# Patient Record
Sex: Male | Born: 1975 | Race: White | Hispanic: No | Marital: Married | Smoking: Light tobacco smoker
Health system: Southern US, Community
[De-identification: ages and names within clinical notes are randomized; demographics above are authoritative.]

## PROBLEM LIST (undated history)

## (undated) ENCOUNTER — Emergency Department (HOSPITAL_COMMUNITY): Discharge status: Absent without leave | Payer: Self-pay

## (undated) DIAGNOSIS — B1920 Unspecified viral hepatitis C without hepatic coma: Secondary | ICD-10-CM

---

## 2014-12-21 ENCOUNTER — Observation Stay (HOSPITAL_COMMUNITY)
Admission: EM | Admit: 2014-12-21 | Discharge: 2014-12-22 | Disposition: A | Discharge status: Home / Self Care | Payer: Self-pay | Attending: Internal Medicine | Admitting: Internal Medicine

## 2014-12-21 ENCOUNTER — Encounter (HOSPITAL_COMMUNITY): Payer: Self-pay | Admitting: *Deleted

## 2014-12-21 DIAGNOSIS — T672XXA Heat cramp, initial encounter: Principal | ICD-10-CM

## 2014-12-21 DIAGNOSIS — R748 Abnormal levels of other serum enzymes: Secondary | ICD-10-CM | POA: Diagnosis present

## 2014-12-21 DIAGNOSIS — R7401 Elevation of levels of liver transaminase levels: Secondary | ICD-10-CM

## 2014-12-21 DIAGNOSIS — R740 Nonspecific elevation of levels of transaminase and lactic acid dehydrogenase [LDH]: Secondary | ICD-10-CM | POA: Insufficient documentation

## 2014-12-21 DIAGNOSIS — X30XXXA Exposure to excessive natural heat, initial encounter: Secondary | ICD-10-CM | POA: Insufficient documentation

## 2014-12-21 DIAGNOSIS — M6282 Rhabdomyolysis: Secondary | ICD-10-CM

## 2014-12-21 DIAGNOSIS — R7301 Impaired fasting glucose: Secondary | ICD-10-CM | POA: Diagnosis present

## 2014-12-21 DIAGNOSIS — N179 Acute kidney failure, unspecified: Secondary | ICD-10-CM

## 2014-12-21 DIAGNOSIS — Y9389 Activity, other specified: Secondary | ICD-10-CM | POA: Insufficient documentation

## 2014-12-21 DIAGNOSIS — Y9289 Other specified places as the place of occurrence of the external cause: Secondary | ICD-10-CM | POA: Insufficient documentation

## 2014-12-21 DIAGNOSIS — Y998 Other external cause status: Secondary | ICD-10-CM | POA: Insufficient documentation

## 2014-12-21 LAB — URINALYSIS, ROUTINE W REFLEX MICROSCOPIC
Bilirubin Urine: NEGATIVE
GLUCOSE, UA: NEGATIVE mg/dL
Hgb urine dipstick: NEGATIVE
Ketones, ur: 15 mg/dL — AB
LEUKOCYTES UA: NEGATIVE
NITRITE: NEGATIVE
PH: 5.5 (ref 5.0–8.0)
PROTEIN: NEGATIVE mg/dL
Specific Gravity, Urine: 1.026 (ref 1.005–1.030)
Urobilinogen, UA: 1 mg/dL (ref 0.0–1.0)

## 2014-12-21 LAB — COMPREHENSIVE METABOLIC PANEL
ALT: 195 U/L — ABNORMAL HIGH (ref 17–63)
ANION GAP: 8 (ref 5–15)
AST: 108 U/L — ABNORMAL HIGH (ref 15–41)
Albumin: 4.4 g/dL (ref 3.5–5.0)
Alkaline Phosphatase: 93 U/L (ref 38–126)
BUN: 22 mg/dL — ABNORMAL HIGH (ref 6–20)
CHLORIDE: 99 mmol/L — AB (ref 101–111)
CO2: 28 mmol/L (ref 22–32)
Calcium: 9.6 mg/dL (ref 8.9–10.3)
Creatinine, Ser: 1.55 mg/dL — ABNORMAL HIGH (ref 0.61–1.24)
GFR, EST NON AFRICAN AMERICAN: 55 mL/min — AB (ref >60)
Glucose, Bld: 169 mg/dL — ABNORMAL HIGH (ref 65–99)
POTASSIUM: 4.3 mmol/L (ref 3.5–5.1)
Sodium: 135 mmol/L (ref 135–145)
Total Bilirubin: 1.6 mg/dL — ABNORMAL HIGH (ref 0.3–1.2)
Total Protein: 8.5 g/dL — ABNORMAL HIGH (ref 6.5–8.1)

## 2014-12-21 LAB — CBC WITH DIFFERENTIAL/PLATELET
BASOS ABS: 0 10*3/uL (ref 0.0–0.1)
Basophils Relative: 0 % (ref 0–1)
Eosinophils Absolute: 0.1 10*3/uL (ref 0.0–0.7)
Eosinophils Relative: 2 % (ref 0–5)
HCT: 43.2 % (ref 39.0–52.0)
HEMOGLOBIN: 15.6 g/dL (ref 13.0–17.0)
LYMPHS ABS: 2.2 10*3/uL (ref 0.7–4.0)
LYMPHS PCT: 23 % (ref 12–46)
MCH: 30.8 pg (ref 26.0–34.0)
MCHC: 36.1 g/dL — ABNORMAL HIGH (ref 30.0–36.0)
MCV: 85.2 fL (ref 78.0–100.0)
Monocytes Absolute: 1.1 10*3/uL — ABNORMAL HIGH (ref 0.1–1.0)
Monocytes Relative: 12 % (ref 3–12)
NEUTROS PCT: 63 % (ref 43–77)
Neutro Abs: 6.1 10*3/uL (ref 1.7–7.7)
Platelets: 214 10*3/uL (ref 150–400)
RBC: 5.07 MIL/uL (ref 4.22–5.81)
RDW: 12.2 % (ref 11.5–15.5)
WBC: 9.6 10*3/uL (ref 4.0–10.5)

## 2014-12-21 LAB — CK: Total CK: 1627 U/L — ABNORMAL HIGH (ref 49–397)

## 2014-12-21 MED ORDER — ACETAMINOPHEN 650 MG RE SUPP
650.0000 mg | Freq: Four times a day (QID) | RECTAL | Status: DC | PRN
Start: 2014-12-21 — End: 2014-12-22

## 2014-12-21 MED ORDER — SODIUM CHLORIDE 0.9 % IJ SOLN
3.0000 mL | Freq: Two times a day (BID) | INTRAMUSCULAR | Status: DC
  Administered 2014-12-21: 10 mL via INTRAVENOUS

## 2014-12-21 MED ORDER — ACETAMINOPHEN 325 MG PO TABS: 650.0000 mg | ORAL_TABLET | Freq: Four times a day (QID) | ORAL | Status: DC | PRN

## 2014-12-21 MED ORDER — SODIUM CHLORIDE 0.9 % IV SOLN
INTRAVENOUS | Status: AC
  Administered 2014-12-21: 19:00:00 via INTRAVENOUS

## 2014-12-21 MED ORDER — SODIUM CHLORIDE 0.9 % IV BOLUS (SEPSIS)
1000.0000 mL | Freq: Once | INTRAVENOUS | Status: AC
  Administered 2014-12-21: 1000 mL via INTRAVENOUS

## 2014-12-21 NOTE — ED Notes (Signed)
Patient states "my whole body is cramping".   Patient states "I tweaked my back about 3 wks ago and my lower back hurts worse".

## 2014-12-21 NOTE — H&P (Signed)
Date: 12/21/2014               Patient Name:  Jerry Mitchell MRN: [id number]  DOB: April 13, 1976 Age / Sex: 39 y.o., male   PCP: None              Medical Service: Internal Medicine Teaching Service     I have reviewed the note by Jerry Mitchell MS4 and was present during the interview and physical exam.  Please see below for findings, assessment, and plan.  Chief Complaint: Dehydration  History of Present Illness: Jerry Mitchell is a 39yo gentleman with no significant PMH who presents to the ED complaining of dehydration for the past day. He has been working on a roof in the heat all day when the symptoms began to present on 12/20/14. Symptoms continued overnight so he came to the ED. He endorses generalized muscle pains, weakness to the point of difficulty holding a hammer, inability to close his mouth, and nausea with two small episodes of NBNB emesis. He endorses lower back pain which started a few weeks prior from lifting weights. He denies headaches, shortness of breath, chest pain, abdominal pain, dysuria or hematuria.  In the ED, patient was found to have CK of 1627 with an elevated creatinine of 1.55. AST/ALT also elevated to 108/195. The patient was given liter bolus of normal saline x2 and admitted for further care.  Meds: No current facility-administered medications for this encounter.    Allergies: Allergies as of 12/21/2014  . (No Known Allergies)    Past Medical History: History reviewed. No pertinent past medical history.  Family History: No family history on file.  Social History: Social History   Social History  . Marital Status: Single    Spouse Name: N/A  . Number of Children: N/A  . Years of Education: N/A   Occupational History  . Not on file.   Social History Main Topics  . Smoking status: Never Smoker   . Smokeless tobacco: Not on file  . Alcohol Use: No  . Drug Use: No  . Sexual Activity: Not on file   Other Topics Concern  . Not on file   Social  History Narrative  . No narrative on file   Surgical History: History reviewed. No pertinent past surgical history.  Review of System: A comprehensive review of systems was negative unless noted in HPI  Physical Exam: Blood pressure 121/76, pulse 74, temperature 97.9 F (36.6 C), temperature source Oral, resp. rate 16, height 5\' 7"  (1.702 m), weight 110.995 kg (244 lb 11.2 oz), SpO2 97 %.  Physical Exam Gen: Well appearing, NAD, sitting up in bed HEENT: MMM, EOMI, no LAD CV: RRR, normal S1 S2, no murmurs, rubs, or gallops appreciated. Pulm: Clear to ascultation bilaterally, no wheezes or crackles. GI: +BS, nondistended, nontender to palpation, no hepatomegaly Ext: Diffuse soreness to extremities, particularly lower extremities. Pulses 2+ bilaterally, warm to touch, no lower extremity edema. Warm and well perfused. Neuro: Motor and sensory grossly normal Psych: A&Ox3  Labs: Reviewed as noted in the Electronic Record  Imaging: Reviewed as noted in the Electronic Record  Assessment & Plan by Problem:  Jerry Mitchell is a 39yo gentleman who presents with rhabdomyolysis.  *Rhabdomyolysis: He has been working in the heat on rooftops when symptoms of generalized myalgias, weakness, and nausea/vomiting presented. CK upon admission to 1627 along with transaminitis and AKI. N S/p 1L bolus NS x2 in ED. Not on any medications that could cause elevated CK. Potassium  normal at 4.3. - continue to monitor electrolytes - NS @ 123ml/hr  - AM CMP  *Acute Kidney Injury: Most likely secondary to rhabdo. Elevated to 1.55 although baseline is unknown. Urinalysis showed amber urine but no blood present. Urine continues to be dark after fluid boluses. - Continue fluid management as above - CMP in AM  *Transaminitis: AST/ALT elevated to 108/195 with elevated bilirubin to 1.6 on presentation. Most likely due to rhabdo. No upper quadrant pain or hepatomegaly suggestive of hepatitis. Tbili slightly up at  1.6. - fluid management as above - morning CMP  FEN/GI: Regular diet  Dispo: Disposition is deferred at this time, awaiting improvement of current medical problems. Anticipated discharge in approximately 1 day(s).   The patient does not have a current PCP (No primary care provider on file.) and does not need an [facility] hospital follow-up appointment after discharge.  The patient does not know have transportation limitations that hinder transportation to clinic appointments.  Signed: Rozelle Logan, Med Student 12/21/2014, 5:36 PM

## 2014-12-21 NOTE — ED Notes (Signed)
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 Pt states that he feels like he is dehydrated and needs fluids

## 2014-12-21 NOTE — H&P (Signed)
Date: 12/21/2014               Patient Name:  Jerry Mitchell MRN: [id number]  DOB: [birth year] Age / Sex: 39 y.o., male   PCP: No primary care provider on file.         Medical Service: Internal Medicine Teaching Service         Attending Physician: Dr. Doneen Poisson, MD    First Contact: Eliezer Mccoy MS4 Pager: [phone]  Second Contact: Dr. 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  He started having muscle cramps and dark urine started yesterday and continue into today so he came to be evaluated.  He says this happened in the past and he received fluids and felt better.  He feels much improved after fluid boluses in the ED.    Meds: No current facility-administered medications for this encounter.    Allergies: Allergies as of 12/21/2014  . (No Known Allergies)   History reviewed. No pertinent past medical history. History reviewed. No pertinent past surgical history. No family history on file. Social History   Social History  . Marital Status: Single    Spouse Name: N/A  . Number of Children: N/A  . Years of Education: N/A   Occupational History  . Not on file.   Social History Main Topics  . Smoking status: Never Smoker   . Smokeless tobacco: Not on file  . Alcohol Use: No  . Drug Use: No  . Sexual Activity: Not on file   Other Topics Concern  . Not on file   Social History Narrative  . No narrative on file    Review of Systems: General:  Denies weight loss or fevers Cardiopulmonary:  Denies chest pain or dyspnea GI: +nausea and 2 small episodes of vomiting, denies abdominal pain GU:  Dark urine MSK:  Leg cramping  Physical  Exam: Blood pressure 121/76, pulse 74, temperature 97.9 F (36.6 C), temperature source Oral, resp. rate 16, height  (1.702 m), weight 244 lb 11.2 oz (110.995 kg), SpO2 97 %. General: resting in bed in NAD HEENT: [place]/AT Cardiac: RRR, no rubs, murmurs or gallops Pulm: clear to auscultation bilaterally, moving normal volumes of air Abd: soft, nontender, nondistended, BS present Ext: warm and well perfused, no pedal edema Neuro: alert and oriented X3, MAE spontaneously  Lab results: Basic Metabolic Panel:  Recent Labs  57/84/69 1347  NA 135  K 4.3  CL 99*  CO2 28  GLUCOSE 169*  BUN 22*  CREATININE 1.55*  CALCIUM 9.6   Liver Function Tests:  Recent Labs  12/21/14 1347  AST 108*  ALT 195*  ALKPHOS 93  BILITOT 1.6*  PROT 8.5*  ALBUMIN 4.4   CBC:  Recent Labs  12/21/14 1347  WBC 9.6  NEUTROABS 6.1  HGB 15.6  HCT 43.2  MCV 85.2  PLT 214   Cardiac Enzymes:  Recent Labs  12/21/14 1347  CKTOTAL 1627*   Urinalysis:  Recent Labs  12/21/14 1630  COLORURINE AMBER*  LABSPEC 1.026  PHURINE 5.5  GLUCOSEU NEGATIVE  HGBUR NEGATIVE  BILIRUBINUR NEGATIVE  KETONESUR 15*  PROTEINUR NEGATIVE  UROBILINOGEN 1.0  NITRITE NEGATIVE  LEUKOCYTESUR NEGATIVE    Assessment & Plan by Problem: 39 year old healthy male here with muscle cramps after working in the sun yesterday.  Rhabdomyolysis:  CK 1627, LFT elevated, dark urine c/w rhabdo and symptoms improving after 2L in the ED.   Cr 1.55, unknown baseline since he has no records in the system.  Electrolytes are normal. - admit to medsurg for obs - continue IV fluids - regular diet - AM CMP to check Cr and LFTs - likely d/c home in AM  AKI:  2/2 to above.   - IV fluids as above  Diet:  Regular VTE ppx:  SCDs Code:  Full  Dispo: Disposition is deferred at this time, awaiting improvement of current medical problems. Anticipated discharge in approximately 1 day(s).   The patient does not have a current PCP  (No primary care provider on file.) and does not need an [facility] hospital follow-up appointment after discharge.  The patient does not know have transportation limitations that hinder transportation to clinic appointments.  Signed: Yolanda Manges, DO 12/21/2014, 5:38 PM

## 2014-12-21 NOTE — ED Provider Notes (Signed)
CSN: [id number]     Arrival date & time 12/21/14  1328 History  This chart was scribed for non-physician practitioner, Arthor Captain, PA-C, working with Benjiman Core, MD, by Budd Palmer ED Scribe. This patient was seen in room TR01C/TR01C and the patient's care was started at 2:00 PM    Chief Complaint  Patient presents with  . Dehydration   The history is provided by the patient. No language interpreter was used.   HPI Comments: Jerry Mitchell is a 39 y.o. male who presents to the Emergency Department complaining of dehydration onset 1 day ago. 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 He reports associated generalized myalgia, weakness (inability to hold onto a hammer), inability to close his mouth, and nausea. He has a PMHx of lower back pain  Onset a few weeks ago. Pt denies any other medical issues. He denies abdominal pain and urinary sx.  History reviewed. No pertinent past medical history. History reviewed. No pertinent past surgical history. No family history on file. Social History  Substance Use Topics  . Smoking status: Never Smoker   . Smokeless tobacco: None  . Alcohol Use: No    Review of Systems  Constitutional: Positive for activity change.  Gastrointestinal: Positive for nausea.  Musculoskeletal: Positive for myalgias and back pain.  Neurological: Positive for weakness and headaches.   A complete 10 system review of systems was obtained and all systems are negative except as noted in the HPI and PMH.   Allergies  Review of patient's allergies indicates no known allergies.  Home Medications   Prior to Admission medications   Not on File   BP 101/59 mmHg  Pulse 58  Temp(Src) 97.9 F (36.6 C) (Oral)  Resp 16  Ht 5\' 11"  (1.803 m)  Wt 252 lb 3.3 oz (114.4 kg)  BMI 35.19 kg/m2  SpO2 98% Physical Exam  Constitutional: He appears well-developed and well-nourished.  HENT:  Head:  Normocephalic and atraumatic.  Eyes: Conjunctivae are normal. Right eye exhibits no discharge. Left eye exhibits no discharge.  Cardiovascular: Normal rate, regular rhythm and normal heart sounds.   Pulmonary/Chest: Effort normal and breath sounds normal. No respiratory distress.  Neurological: He is alert. Coordination normal.  Skin: Skin is warm and dry. No rash noted. He is not diaphoretic. No erythema.  Psychiatric: He has a normal mood and affect.  Nursing note and vitals reviewed.   ED Course  Procedures  DIAGNOSTIC STUDIES: Oxygen Saturation is 96% on RA, adequate by my interpretation.    COORDINATION OF CARE: 2:06 PM - Discussed heat-related injury. Discussed plans to give IV fluids and order diagnostic studies. Pt advised of plan for treatment and pt agrees.  Labs Review Labs Reviewed  COMPREHENSIVE METABOLIC PANEL - Abnormal; Notable for the following:    Chloride 99 (*)    Glucose, Bld 169 (*)    BUN 22 (*)    Creatinine, Ser 1.55 (*)    Total Protein 8.5 (*)    AST 108 (*)    ALT 195 (*)    Total Bilirubin 1.6 (*)    GFR calc non Af Amer 55 (*)    All other components within normal limits  CBC WITH DIFFERENTIAL/PLATELET - Abnormal; Notable for the following:    MCHC 36.1 (*)    Monocytes Absolute 1.1 (*)    All other components within normal limits  URINALYSIS, ROUTINE W REFLEX MICROSCOPIC (NOT  AT [facility]) - Abnormal; Notable for the following:    Color, Urine AMBER (*)    Ketones, ur 15 (*)    All other components within normal limits  CK - Abnormal; Notable for the following:    Total CK 1627 (*)    All other components within normal limits  COMPREHENSIVE METABOLIC PANEL - Abnormal; Notable for the following:    Glucose, Bld 111 (*)    Calcium 8.6 (*)    Albumin 3.3 (*)    AST 105 (*)    ALT 170 (*)    Anion gap 4 (*)    All other components within normal limits  CK - Abnormal; Notable for the following:    Total CK 1073 (*)    All other components  within normal limits  HIV ANTIBODY (ROUTINE TESTING)    Imaging Review No results found. I have personally reviewed and evaluated these images and lab results as part of my medical decision-making.   EKG Interpretation None      MDM   Final diagnoses:  Heat cramps, initial encounter  AKI (acute kidney injury)  Transaminitis  Non-traumatic rhabdomyolysis    Patient with acute heat injury.recievinf fluid. Patient will be admitted  AKI, increased ck, transaminitis   I personally performed the services described in this documentation, which was scribed in my presence. The recorded information has been reviewed and is accurate.      Arthor Captain, PA-C 01/12/15 2344  Benjiman Core, MD 01/13/15 [phone]

## 2014-12-22 ENCOUNTER — Encounter: Payer: Self-pay | Admitting: Internal Medicine

## 2014-12-22 DIAGNOSIS — R748 Abnormal levels of other serum enzymes: Secondary | ICD-10-CM | POA: Diagnosis present

## 2014-12-22 DIAGNOSIS — R7301 Impaired fasting glucose: Secondary | ICD-10-CM | POA: Diagnosis present

## 2014-12-22 DIAGNOSIS — N179 Acute kidney failure, unspecified: Secondary | ICD-10-CM | POA: Diagnosis present

## 2014-12-22 DIAGNOSIS — M6282 Rhabdomyolysis: Secondary | ICD-10-CM

## 2014-12-22 DIAGNOSIS — Z114 Encounter for screening for human immunodeficiency virus [HIV]: Secondary | ICD-10-CM

## 2014-12-22 DIAGNOSIS — R945 Abnormal results of liver function studies: Secondary | ICD-10-CM

## 2014-12-22 LAB — COMPREHENSIVE METABOLIC PANEL
ALBUMIN: 3.3 g/dL — AB (ref 3.5–5.0)
ALK PHOS: 71 U/L (ref 38–126)
ALT: 170 U/L — AB (ref 17–63)
ANION GAP: 4 — AB (ref 5–15)
AST: 105 U/L — ABNORMAL HIGH (ref 15–41)
BILIRUBIN TOTAL: 1 mg/dL (ref 0.3–1.2)
BUN: 12 mg/dL (ref 6–20)
CALCIUM: 8.6 mg/dL — AB (ref 8.9–10.3)
CO2: 31 mmol/L (ref 22–32)
CREATININE: 1.12 mg/dL (ref 0.61–1.24)
Chloride: 103 mmol/L (ref 101–111)
GFR calc non Af Amer: >60 mL/min (ref >60)
GLUCOSE: 111 mg/dL — AB (ref 65–99)
Potassium: 4.5 mmol/L (ref 3.5–5.1)
Sodium: 138 mmol/L (ref 135–145)
TOTAL PROTEIN: 6.6 g/dL (ref 6.5–8.1)

## 2014-12-22 LAB — CK: Total CK: 1073 U/L — ABNORMAL HIGH (ref 49–397)

## 2014-12-22 LAB — HIV ANTIBODY (ROUTINE TESTING W REFLEX): HIV Screen 4th Generation wRfx: NONREACTIVE

## 2014-12-22 NOTE — Progress Notes (Signed)
  Subjective: NAEON. The patient states he overall feels better overnight. He has some continued muscle cramping however it has improved with fluid administration. The patient is very eager to leave the hospital to return to work.  Objective: Vital signs in last 24 hours: Filed Vitals:   12/21/14 1554 12/21/14 1812 12/21/14 2022 12/22/14 0413  BP: 121/76 125/64 121/56 101/59  Pulse: 74 80 74 58  Temp:  97.5 F (36.4 C) 98.5 F (36.9 C) 97.9 F (36.6 C)  TempSrc:  Oral Oral Oral  Resp: Height:      Weight:   114.4 kg (252 lb 3.3 oz)   SpO2: 97% 98% 98% 98%   Weight change:   Intake/Output Summary (Last 24 hours) at 12/22/14 1037 Last data filed at 12/22/14 0600  Gross per 24 hour  Intake   3125 ml  Output      0 ml  Net   3125 ml    Physical Exam Gen: Well appearing, NAD HEENT: MMM, EOMI, no LAD, no JVD CV: RRR, normal S1 S2, no murmurs, rubs, or gallops appreciated. Pulm: Clear to ascultation bilaterally, no wheezes or crackles. GI: +BS, nondistended, nontender to palpation, no hepatosplenomegaly Ext: Pulses 2+ bilaterally, warm to touch. Tenderness to palpation of lower extremities not present. Neuro: Motor and sensory grossly normal  Lab Results: Labs reviewed in Epic  Imaging Studies/Results: Imaging reviewed in Epic  Medications:  Medication reviewed in Epic  Assessment/Plan: Jerry Mitchell is a 39yo gentleman who presents with rhabdomyolysis.  *Rhabdomyolysis: He has been working in the heat on rooftops when symptoms of generalized myalgias, weakness, and nausea/vomiting presented. CK upon admission to 1627 along with transaminitis and AKI. N S/p 1L bolus NS x2 in ED. Not on any medications that could cause elevated CK. Potassium normal at 4.3 upon admission - CK downtrending to 1073 this AM. CMP continues to be unremarkable - recommend continued oral rehydration  *Acute Kidney Injury: Most likely secondary to rhabdo. Elevated to 1.55 although  baseline is unknown. Urinalysis showed amber urine but no blood present. Urine continues to be dark after fluid boluses. - creatinine downtrending to 1.12 today - fluids as above, oral rehydration  *Transaminitis: AST/ALT elevated to 108/195 with elevated bilirubin to 1.6 on presentation. Most likely due to rhabdo. No upper quadrant pain or hepatomegaly suggestive of hepatitis. Tbili slightly up at 1.6. - AST and ALT downtrending, Tbili normal this AM  FEN/GI: Regular diet  Dispo: Home  This is a Psychologist, occupational Note.  The care of the patient was discussed with Dr. Johna Roles and the assessment and plan formulated with their assistance.  Please see their attached note for official documentation of the daily encounter.    Rozelle Logan, Med Student 12/22/2014, 10:37 AM

## 2014-12-22 NOTE — Progress Notes (Signed)
Subjective:  Pt seen and examined in AM. No acute events overnight. Pt reports feeling much better with mild muscle cramping and resolution of dark urine. He would like to go home today.    Objective: Vital signs in last 24 hours: Filed Vitals:   12/21/14 1554 12/21/14 1812 12/21/14 2022 12/22/14 0413  BP: 121/76 125/64 121/56 101/59  Pulse: 74 80 74 58  Temp:  97.5 F (36.4 C) 98.5 F (36.9 C) 97.9 F (36.6 C)  TempSrc:  Oral Oral Oral  Resp: Height:      Weight:   252 lb 3.3 oz (114.4 kg)   SpO2: 97% 98% 98% 98%   Weight change:   Intake/Output Summary (Last 24 hours) at 12/22/14 1019 Last data filed at 12/22/14 0600  Gross per 24 hour  Intake   3125 ml  Output      0 ml  Net   3125 ml   PHYSICAL EXAMINATION:   General: NAD  Heart: Normal rate and rhythm with no murmur  Lungs: Clear to ausculation with no wheezing, ronchi, or rales  Abdomen: Soft, non-tender, non-distended, with normal BS Extremities: No edema Neuro: A & O x 4   Lab Results: Basic Metabolic Panel:  Recent Labs Lab 12/21/14 1347 12/22/14 0730  NA 135 138  K 4.3 4.5  CL 99* 103  CO2 28 31  GLUCOSE 169* 111*  BUN 22* 12  CREATININE 1.55* 1.12  CALCIUM 9.6 8.6*   Liver Function Tests:  Recent Labs Lab 12/21/14 1347 12/22/14 0730  AST 108* 105*  ALT 195* 170*  ALKPHOS 93 71  BILITOT 1.6* 1.0  PROT 8.5* 6.6  ALBUMIN 4.4 3.3*   CBC:  Recent Labs Lab 12/21/14 1347  WBC 9.6  NEUTROABS 6.1  HGB 15.6  HCT 43.2  MCV 85.2  PLT 214   Cardiac Enzymes:  Recent Labs Lab 12/21/14 1347 12/22/14 0730  CKTOTAL 1627* 1073*   Urinalysis:  Recent Labs Lab 12/21/14 1630  COLORURINE AMBER*  LABSPEC 1.026  PHURINE 5.5  GLUCOSEU NEGATIVE  HGBUR NEGATIVE  BILIRUBINUR NEGATIVE  KETONESUR 15*  PROTEINUR NEGATIVE  UROBILINOGEN 1.0  NITRITE NEGATIVE  LEUKOCYTESUR NEGATIVE     Micro Results: No results found for this or any previous visit (from the past  240 hour(s)). Studies/Results: No results found. Medications: I have reviewed the patient's current medications. Scheduled Meds: . sodium chloride  3 mL Intravenous Q12H   Continuous Infusions:  PRN Meds:.acetaminophen **OR** acetaminophen Assessment/Plan:  Mild Rhabdomyolysis - Pt with improved myalgias, clear urine, and improved CK level from 1627 to 1073.  -Stop IVF's and encourage oral hydration  -Pt given work excusal letter to return to work in 1 day with instructions to hydrate adequately while working outside in the sun -Pt does not want to follow-up with primary care physician   Non-oliguric AKI - Cr improved to 1.12 from 1.55 with unknown baseline. GFR normal. Pt with normal urine output. Etiology most likely due to pre-renal azotemia in setting of volume depletion from working outside in hot weather conditions. -Pt encouraged on adequate oral hydration   Elevated Liver Enzymes - AST/ ALT improved to 105/170 from 108/195 most likely due to mild rhabdomyolysis.   -Pt encouraged on adequate oral hydration  -Consider testing for hepatitis if does not normalize  Elevated Fasting Blood Glucose - BG 111 this AM.  -Consider testing for A1c as outpatient  Annual HIV Screening - Negative for HIV infection.  Dispo: Today    The patient does not have a current PCP (No primary care provider on file.) and does not need an [facility] hospital follow-up appointment after discharge.  The patient does not have transportation limitations that hinder transportation to clinic appointments.  .Services Needed at time of discharge: Y = Yes, Blank = No PT:  No  OT:  No  RN:  No  Equipment:  No  Other:  No      Otis Brace, MD 12/22/2014, 10:19 AM

## 2014-12-22 NOTE — Progress Notes (Signed)
Patient Discharge:  Disposition: Pt discharged home with significant other  Education: Educated on all discharge instructions. Pt verbalized understanding.  IV: Removed  Telemetry: N/A  Follow-up appointments: N/A  Prescriptions: N/A  Transportation: Transported home by significant other  Belongings:All belongings taken with pt.

## 2014-12-22 NOTE — Discharge Summary (Signed)
Name: Jerry Mitchell MRN: [id number] DOB: [birth year] 39 y.o. PCP: No primary care provider on file.  Date of Admission: 12/21/2014  1:43 PM Date of Discharge: 12/22/2014 Attending Physician: Doneen Poisson MD  Discharge Diagnosis:  Mild Rhabdomyolysis   Non-oliguric AKI  Elevated Liver Enzymes   Elevated Fasting Blood Glucose HIV Screening     Discharge Medications:   Medication List    Notice    You have not been prescribed any medications.      Disposition and follow-up:   Mr.Jerry Mitchell was discharged from [facility] in Good condition.  At the hospital follow up visit please address:  1.  Resolution of myalgias and if adequate hydration while working outside  2.  Labs / imaging needed at time of follow-up: CMP, A1c, consider acute hepatitis panel if AST/ALT do not normalize    3.  Pending labs/ test needing follow-up: None  Follow-up Appointments: Pt did not want to follow-up with a primary care physician    Discharge Instructions:     Discharge Instructions    Activity as tolerated - No restrictions    Complete by:  As directed      Call MD for:  extreme fatigue    Complete by:  As directed      Call MD for:  persistant dizziness or light-headedness    Complete by:  As directed      Call MD for:  persistant nausea and vomiting    Complete by:  As directed      Call MD for:  severe uncontrolled pain    Complete by:  As directed      Call MD for:  temperature >100.4    Complete by:  As directed      Diet general    Complete by:  As directed      Discharge instructions    Complete by:  As directed   You came in with muscle pains and weakness from significant dehydration. Your kidneys were slightly injured from the dehydration but have recovered while in the hospital. It is very important that when you are working in the heat, drink plenty of water and Gatorade. Stay out of the heat when possible. If you start to feel sick, have a headache,  fever, nausea, vomiting, or muscle weakness, get into the shade and see a doctor when possible.  Also, please wear sunscreen when working. This will decrease the chances of you developing cancer later on in life.           Consultations: None  Procedures Performed:  No results found.   Admission HPI: Original Author Evelena Peat DO  Mr. 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 He started having muscle cramps and dark urine started yesterday and continue into today so he came to be evaluated. He says this happened in the past and he received fluids and felt better. He feels much improved after fluid boluses in the ED.    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 Urine analysis did not reveal evidence of myoglobinuria. Potassium and calcium levels were normal. Pt reports having similar episode in the past. He  received fluid resuscitation with normal saline with improvement of myalgias, normalization of dark urine, and improvement of CK level to 1073. Pt was encouraged on adequate oral hydration while working outside in hot weather conditions and instructed to return to work in one day. Pt did not want to follow-up with a primary care physician and instructed to return to the ED if he had similar symptoms.    Non-oliguric AKI - Cr improved to 1.12 with normal GFR from 1.55 on admission with unknown baseline. Pt with normal urine output. Urine analysis did not reveal evidence of proteinuria or hematuria. Etiology most likely due to pre-renal azotemia in setting of volume depletion from working outside in hot weather conditions and mild rhabdomyolysis. Pt encouraged on adequate  oral hydration while working outside in hot weather conditions.   Elevated Liver Enzymes - AST/ ALT improved to 105/170 from 108/195 on admission with unknown baseline most likely due to mild rhabdomyolysis. Pt encouraged on adequate oral hydration while working outside in hot weather conditions. Pt with no reported alcohol use and no past abdominal imaging available. Consider outpatient testing for hepatitis if does not normalize., however patient did not want to follow-up with a primary care physician.   Elevated Fasting Blood Glucose - Pt with fasting blood glucose range of 111-169 during hospitalization.  Consider testing for A1c as outpatient, however pt did not want to follow-up with a primary care physician.    HIV Screening - Pt tested negative for HIV infection.     Discharge Vitals:   BP 101/59 mmHg  Pulse 58  Temp(Src) 97.9 F (36.6 C) (Oral)  Resp 16  Ht 5\' 11"  (1.803 m)  Wt 252 lb 3.3 oz (114.4 kg)  BMI 35.19 kg/m2  SpO2 98%  Discharge Labs:  Results for orders placed or performed during the hospital encounter of 12/21/14 (from the past 24 hour(s))  Urinalysis, Routine w reflex microscopic (not at [facility])     Status: Abnormal   Collection Time: 12/21/14  4:30 PM  Result Value Ref Range   Color, Urine AMBER (A) YELLOW   APPearance CLEAR CLEAR   Specific Gravity, Urine 1.026 1.005 - 1.030   pH 5.5 5.0 - 8.0   Glucose, UA NEGATIVE NEGATIVE mg/dL   Hgb urine dipstick NEGATIVE NEGATIVE   Bilirubin Urine NEGATIVE NEGATIVE   Ketones, ur 15 (A) NEGATIVE mg/dL   Protein, ur NEGATIVE NEGATIVE mg/dL   Urobilinogen, UA 1.0 0.0 - 1.0 mg/dL   Nitrite NEGATIVE NEGATIVE   Leukocytes, UA NEGATIVE NEGATIVE  Comprehensive metabolic panel Once     Status: Abnormal   Collection Time: 12/22/14  7:30 AM  Result Value Ref Range   Sodium 138 135 - 145 mmol/L   Potassium 4.5 3.5 - 5.1 mmol/L   Chloride 103 101 - 111 mmol/L   CO2 31 22 - 32 mmol/L   Glucose, Bld 111 (H) 65 - 99 mg/dL     BUN 12 6 - 20 mg/dL   Creatinine, Ser 1.19 0.61 - 1.24 mg/dL   Calcium 8.6 (L) 8.9 - 10.3 mg/dL   Total Protein 6.6 6.5 - 8.1 g/dL   Albumin 3.3 (L) 3.5 - 5.0 g/dL   AST 147 (H) 15 - 41 U/L   ALT 170 (H) 17 - 63 U/L   Alkaline Phosphatase 71 38 - 126 U/L   Total Bilirubin 1.0 0.3 - 1.2 mg/dL   GFR calc non Af Amer >60 >60 mL/min   GFR calc Af Amer >60 >60 mL/min  Anion gap 4 (L) 5 - 15  HIV antibody     Status: None   Collection Time: 12/22/14  7:30 AM  Result Value Ref Range   HIV Screen 4th Generation wRfx Non Reactive Non Reactive  CK     Status: Abnormal   Collection Time: 12/22/14  7:30 AM  Result Value Ref Range   Total CK 1073 (H) 49 - 397 U/L    Signed: Otis Brace, MD 12/22/2014, 3:33 PM    Services Ordered on Discharge: None Equipment Ordered on Discharge: None

## 2015-05-07 ENCOUNTER — Emergency Department (HOSPITAL_COMMUNITY): Payer: Self-pay

## 2015-05-07 ENCOUNTER — Encounter (HOSPITAL_COMMUNITY): Payer: Self-pay | Admitting: Neurology

## 2015-05-07 ENCOUNTER — Emergency Department (HOSPITAL_COMMUNITY)
Admission: EM | Admit: 2015-05-07 | Discharge: 2015-05-07 | Disposition: A | Discharge status: Home / Self Care | Payer: Self-pay | Attending: Emergency Medicine | Admitting: Emergency Medicine

## 2015-05-07 DIAGNOSIS — Y998 Other external cause status: Secondary | ICD-10-CM | POA: Insufficient documentation

## 2015-05-07 DIAGNOSIS — Y9289 Other specified places as the place of occurrence of the external cause: Secondary | ICD-10-CM | POA: Insufficient documentation

## 2015-05-07 DIAGNOSIS — T148XXA Other injury of unspecified body region, initial encounter: Secondary | ICD-10-CM

## 2015-05-07 DIAGNOSIS — F17200 Nicotine dependence, unspecified, uncomplicated: Secondary | ICD-10-CM | POA: Insufficient documentation

## 2015-05-07 DIAGNOSIS — S71102A Unspecified open wound, left thigh, initial encounter: Secondary | ICD-10-CM | POA: Insufficient documentation

## 2015-05-07 DIAGNOSIS — Y9301 Activity, walking, marching and hiking: Secondary | ICD-10-CM | POA: Insufficient documentation

## 2015-05-07 MED ORDER — OXYCODONE-ACETAMINOPHEN 5-325 MG PO TABS
2.0000 | ORAL_TABLET | Freq: Once | ORAL | Status: AC
  Administered 2015-05-07: 2 via ORAL
  Filled 2015-05-07: qty 2

## 2015-05-07 MED ORDER — TRAMADOL HCL 50 MG PO TABS: 50.0000 mg | ORAL_TABLET | Freq: Four times a day (QID) | ORAL | Status: DC | PRN

## 2015-05-07 MED ORDER — IBUPROFEN 400 MG PO TABS
600.0000 mg | ORAL_TABLET | Freq: Once | ORAL | Status: AC
  Administered 2015-05-07: 600 mg via ORAL
  Filled 2015-05-07: qty 1
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
## 2015-05-07 NOTE — ED Notes (Signed)
Pt verbalizes understanding of instructions. 

## 2015-05-07 NOTE — ED Notes (Signed)
Patient transported to X-ray 
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
## 2015-07-17 ENCOUNTER — Emergency Department (HOSPITAL_COMMUNITY)
Admission: EM | Admit: 2015-07-17 | Discharge: 2015-07-17 | Disposition: A | Discharge status: Home / Self Care | Payer: Self-pay | Attending: Emergency Medicine | Admitting: Emergency Medicine

## 2015-07-17 ENCOUNTER — Emergency Department (HOSPITAL_COMMUNITY): Payer: Self-pay

## 2015-07-17 ENCOUNTER — Encounter (HOSPITAL_COMMUNITY): Payer: Self-pay | Admitting: Emergency Medicine

## 2015-07-17 DIAGNOSIS — J159 Unspecified bacterial pneumonia: Secondary | ICD-10-CM | POA: Insufficient documentation

## 2015-07-17 DIAGNOSIS — J189 Pneumonia, unspecified organism: Secondary | ICD-10-CM

## 2015-07-17 DIAGNOSIS — F17200 Nicotine dependence, unspecified, uncomplicated: Secondary | ICD-10-CM | POA: Insufficient documentation

## 2015-07-17 LAB — COMPREHENSIVE METABOLIC PANEL
ALT: 101 U/L — AB (ref 17–63)
AST: 39 U/L (ref 15–41)
Albumin: 4 g/dL (ref 3.5–5.0)
Alkaline Phosphatase: 63 U/L (ref 38–126)
Anion gap: 11 (ref 5–15)
BUN: 17 mg/dL (ref 6–20)
CALCIUM: 9.5 mg/dL (ref 8.9–10.3)
CHLORIDE: 108 mmol/L (ref 101–111)
CO2: 24 mmol/L (ref 22–32)
CREATININE: 1.07 mg/dL (ref 0.61–1.24)
Glucose, Bld: 124 mg/dL — ABNORMAL HIGH (ref 65–99)
Potassium: 3.8 mmol/L (ref 3.5–5.1)
Sodium: 143 mmol/L (ref 135–145)
TOTAL PROTEIN: 8.1 g/dL (ref 6.5–8.1)
Total Bilirubin: 0.9 mg/dL (ref 0.3–1.2)

## 2015-07-17 LAB — URINALYSIS, ROUTINE W REFLEX MICROSCOPIC
Glucose, UA: NEGATIVE mg/dL
Hgb urine dipstick: NEGATIVE
Ketones, ur: NEGATIVE mg/dL
LEUKOCYTES UA: NEGATIVE
NITRITE: NEGATIVE
PROTEIN: NEGATIVE mg/dL
Specific Gravity, Urine: 1.046 — ABNORMAL HIGH (ref 1.005–1.030)
pH: 5.5 (ref 5.0–8.0)

## 2015-07-17 LAB — CBC
HCT: 41.5 % (ref 39.0–52.0)
Hemoglobin: 14.6 g/dL (ref 13.0–17.0)
MCH: 30.7 pg (ref 26.0–34.0)
MCHC: 35.2 g/dL (ref 30.0–36.0)
MCV: 87.2 fL (ref 78.0–100.0)
PLATELETS: 231 10*3/uL (ref 150–400)
RBC: 4.76 MIL/uL (ref 4.22–5.81)
RDW: 12.7 % (ref 11.5–15.5)
WBC: 11.2 10*3/uL — AB (ref 4.0–10.5)

## 2015-07-17 LAB — LIPASE, BLOOD: LIPASE: 31 U/L (ref 11–51)

## 2015-07-17 MED ORDER — ACETAMINOPHEN 325 MG PO TABS
650.0000 mg | ORAL_TABLET | Freq: Once | ORAL | Status: AC
  Administered 2015-07-17: 650 mg via ORAL
  Filled 2015-07-17: qty 2

## 2015-07-17 MED ORDER — KETOROLAC TROMETHAMINE 60 MG/2ML IM SOLN
60.0000 mg | Freq: Once | INTRAMUSCULAR | Status: AC
  Administered 2015-07-17: 60 mg via INTRAMUSCULAR
  Filled 2015-07-17: qty 2

## 2015-07-17 MED ORDER — AZITHROMYCIN 250 MG PO TABS: 250.0000 mg | ORAL_TABLET | Freq: Every day | ORAL | Status: DC

## 2015-07-17 MED ORDER — AZITHROMYCIN 250 MG PO TABS
500.0000 mg | ORAL_TABLET | Freq: Once | ORAL | Status: AC
  Administered 2015-07-17: 500 mg via ORAL
  Filled 2015-07-17: qty 2

## 2015-07-17 NOTE — ED Notes (Signed)
Pt c/o cough with blood-streaked yellow/green mucus, dizziness, chills, low back pain, nausea, diarrhea. Has had diarrhea today.

## 2015-07-17 NOTE — Progress Notes (Signed)
EDCM spoke to patient at bedside. Patient confirms he does not have a pcp or insurance [place].  [facility] with [facility] to [facility], informed patient of services there.  EDCM also provided patient with list of pcps who accept self pay patients, list of discount pharmacies and websites needymeds.org and GoodRX.com for medication assistance, phone number to inquire about the orange card, phone number to inquire about Medicaid, phone number to inquire about the Affordable Care Act, financial resources in the community such as local churches, [employer], urban ministries, and dental assistance for uninsured patients.  Patient thankful for resources.  No further EDCM needs at this time.  [facility] referral placed for orange card.  Patient agreeable.
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
## 2015-07-17 NOTE — ED Notes (Signed)
Pt is aware a urine sample is needed. Pt reports he urinated before coming to an assigned room.

## 2015-07-17 NOTE — Discharge Instructions (Signed)

## 2015-09-26 ENCOUNTER — Emergency Department (HOSPITAL_COMMUNITY): Payer: Self-pay

## 2015-09-26 ENCOUNTER — Emergency Department (HOSPITAL_COMMUNITY)
Admission: EM | Admit: 2015-09-26 | Discharge: 2015-09-27 | Disposition: A | Discharge status: Home / Self Care | Payer: Self-pay | Attending: Emergency Medicine | Admitting: Emergency Medicine

## 2015-09-26 ENCOUNTER — Encounter (HOSPITAL_COMMUNITY): Payer: Self-pay | Admitting: Emergency Medicine

## 2015-09-26 DIAGNOSIS — Y9289 Other specified places as the place of occurrence of the external cause: Secondary | ICD-10-CM | POA: Insufficient documentation

## 2015-09-26 DIAGNOSIS — Z8619 Personal history of other infectious and parasitic diseases: Secondary | ICD-10-CM | POA: Insufficient documentation

## 2015-09-26 DIAGNOSIS — W19XXXA Unspecified fall, initial encounter: Secondary | ICD-10-CM

## 2015-09-26 DIAGNOSIS — S161XXA Strain of muscle, fascia and tendon at neck level, initial encounter: Secondary | ICD-10-CM | POA: Insufficient documentation

## 2015-09-26 DIAGNOSIS — W1839XA Other fall on same level, initial encounter: Secondary | ICD-10-CM | POA: Insufficient documentation

## 2015-09-26 DIAGNOSIS — F17200 Nicotine dependence, unspecified, uncomplicated: Secondary | ICD-10-CM | POA: Insufficient documentation

## 2015-09-26 DIAGNOSIS — Y9339 Activity, other involving climbing, rappelling and jumping off: Secondary | ICD-10-CM | POA: Insufficient documentation

## 2015-09-26 DIAGNOSIS — Y998 Other external cause status: Secondary | ICD-10-CM | POA: Insufficient documentation

## 2015-09-26 HISTORY — DX: Unspecified viral hepatitis C without hepatic coma: B19.20

## 2015-09-26 MED ORDER — IBUPROFEN 400 MG PO TABS
800.0000 mg | ORAL_TABLET | Freq: Once | ORAL | Status: AC
  Administered 2015-09-26: 800 mg via ORAL
  Filled 2015-09-26: qty 2

## 2015-09-26 MED ORDER — CYCLOBENZAPRINE HCL 10 MG PO TABS
5.0000 mg | ORAL_TABLET | Freq: Once | ORAL | Status: AC
Start: 2015-09-26 — End: 2015-09-26
  Administered 2015-09-26: 5 mg via ORAL
  Filled 2015-09-26: qty 1
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
## 2015-09-27 ENCOUNTER — Emergency Department (HOSPITAL_COMMUNITY): Payer: Self-pay

## 2015-09-27 ENCOUNTER — Encounter (HOSPITAL_COMMUNITY): Payer: Self-pay | Admitting: Radiology

## 2015-09-27 NOTE — ED Notes (Signed)
Patient verbalized understanding of discharge instructions and denies any further needs or questions at this time. VS stable. Patient ambulatory with steady gait.  

## 2015-09-27 NOTE — Discharge Instructions (Signed)
Cervical Sprain  A cervical sprain is an injury in the neck in which the strong, fibrous tissues (ligaments) that connect your neck bones stretch or tear. Cervical sprains can range from mild to severe. Severe cervical sprains can cause the neck vertebrae to be unstable. This can lead to damage of the spinal cord and can result in serious nervous system problems. The amount of time it takes for a cervical sprain to get better depends on the cause and extent of the injury. Most cervical sprains heal in 1 to 3 weeks.  CAUSES   Severe cervical sprains may be caused by:    Contact sport injuries (such as from football, rugby, wrestling, hockey, auto racing, gymnastics, diving, martial arts, or boxing).    Motor vehicle collisions.    Whiplash injuries. This is an injury from a sudden forward and backward whipping movement of the head and neck.   Falls.   Mild cervical sprains may be caused by:    Being in an awkward position, such as while cradling a telephone between your ear and shoulder.    Sitting in a chair that does not offer proper support.    Working at a poorly designed computer station.    Looking up or down for long periods of time.   SYMPTOMS    Pain, soreness, stiffness, or a burning sensation in the front, back, or sides of the neck. This discomfort may develop immediately after the injury or slowly, 24 hours or more after the injury.    Pain or tenderness directly in the middle of the back of the neck.    Shoulder or upper back pain.    Limited ability to move the neck.    Headache.    Dizziness.    Weakness, numbness, or tingling in the hands or arms.    Muscle spasms.    Difficulty swallowing or chewing.    Tenderness and swelling of the neck.   DIAGNOSIS   Most of the time your health care provider can diagnose a cervical sprain by taking your history and doing a physical exam. Your health care provider will ask about previous neck injuries and any known neck  problems, such as arthritis in the neck. X-rays may be taken to find out if there are any other problems, such as with the bones of the neck. Other tests, such as a CT scan or MRI, may also be needed.   TREATMENT   Treatment depends on the severity of the cervical sprain. Mild sprains can be treated with rest, keeping the neck in place (immobilization), and pain medicines. Severe cervical sprains are immediately immobilized. Further treatment is done to help with pain, muscle spasms, and other symptoms and may include:   Medicines, such as pain relievers, numbing medicines, or muscle relaxants.    Physical therapy. This may involve stretching exercises, strengthening exercises, and posture training. Exercises and improved posture can help stabilize the neck, strengthen muscles, and help stop symptoms from returning.   HOME CARE INSTRUCTIONS    Put ice on the injured area.     Put ice in a plastic bag.     Place a towel between your skin and the bag.     Leave the ice on for 15-20 minutes, 3-4 times a day.    If your injury was severe, you may have been given a cervical collar to wear. A cervical collar is a two-piece collar designed to keep your neck from moving while it heals.      Do not remove the collar unless instructed by your health care provider.    If you have long hair, keep it outside of the collar.    Ask your health care provider before making any adjustments to your collar. Minor adjustments may be required over time to improve comfort and reduce pressure on your chin or on the back of your head.    Ifyou are allowed to remove the collar for cleaning or bathing, follow your health care provider's instructions on how to do so safely.    Keep your collar clean by wiping it with mild soap and water and drying it completely. If the collar you have been given includes removable pads, remove them every 1-2 days and hand wash them with soap and water. Allow them to air dry. They should be completely  dry before you wear them in the collar.    If you are allowed to remove the collar for cleaning and bathing, wash and dry the skin of your neck. Check your skin for irritation or sores. If you see any, tell your health care provider.    Do not drive while wearing the collar.    Only take over-the-counter or prescription medicines for pain, discomfort, or fever as directed by your health care provider.    Keep all follow-up appointments as directed by your health care provider.    Keep all physical therapy appointments as directed by your health care provider.    Make any needed adjustments to your workstation to promote good posture.    Avoid positions and activities that make your symptoms worse.    Warm up and stretch before being active to help prevent problems.   SEEK MEDICAL CARE IF:    Your pain is not controlled with medicine.    You are unable to decrease your pain medicine over time as planned.    Your activity level is not improving as expected.   SEEK IMMEDIATE MEDICAL CARE IF:    You develop any bleeding.   You develop stomach upset.   You have signs of an allergic reaction to your medicine.    Your symptoms get worse.    You develop new, unexplained symptoms.    You have numbness, tingling, weakness, or paralysis in any part of your body.   MAKE SURE YOU:    Understand these instructions.   Will watch your condition.   Will get help right away if you are not doing well or get worse.     This information is not intended to replace advice given to you by your health care provider. Make sure you discuss any questions you have with your health care provider.     Document Released: 02/03/2007 Document Revised: 04/13/2013 Document Reviewed: 10/14/2012  Elsevier Interactive Patient Education 2016 [employer]

## 2015-09-27 NOTE — ED Notes (Signed)
Patient transported to CT 

## 2015-10-06 ENCOUNTER — Encounter (HOSPITAL_COMMUNITY): Payer: Self-pay | Admitting: *Deleted

## 2015-10-06 ENCOUNTER — Emergency Department (HOSPITAL_COMMUNITY)
Admission: EM | Admit: 2015-10-06 | Discharge: 2015-10-06 | Disposition: A | Discharge status: Home / Self Care | Payer: Self-pay | Attending: Emergency Medicine | Admitting: Emergency Medicine

## 2015-10-06 ENCOUNTER — Emergency Department (HOSPITAL_COMMUNITY): Payer: Self-pay

## 2015-10-06 DIAGNOSIS — F17200 Nicotine dependence, unspecified, uncomplicated: Secondary | ICD-10-CM | POA: Insufficient documentation

## 2015-10-06 DIAGNOSIS — Y999 Unspecified external cause status: Secondary | ICD-10-CM | POA: Insufficient documentation

## 2015-10-06 DIAGNOSIS — S4992XA Unspecified injury of left shoulder and upper arm, initial encounter: Secondary | ICD-10-CM | POA: Insufficient documentation

## 2015-10-06 DIAGNOSIS — Y939 Activity, unspecified: Secondary | ICD-10-CM | POA: Insufficient documentation

## 2015-10-06 DIAGNOSIS — Y929 Unspecified place or not applicable: Secondary | ICD-10-CM | POA: Insufficient documentation

## 2015-10-06 DIAGNOSIS — M25512 Pain in left shoulder: Secondary | ICD-10-CM

## 2015-10-06 DIAGNOSIS — X509XXA Other and unspecified overexertion or strenuous movements or postures, initial encounter: Secondary | ICD-10-CM | POA: Insufficient documentation

## 2015-10-06 MED ORDER — NAPROXEN 500 MG PO TABS: 500.0000 mg | ORAL_TABLET | Freq: Two times a day (BID) | ORAL | Status: DC

## 2015-10-06 MED ORDER — IBUPROFEN 400 MG PO TABS
800.0000 mg | ORAL_TABLET | Freq: Once | ORAL | Status: AC
  Administered 2015-10-06: 800 mg via ORAL
  Filled 2015-10-06: qty 2

## 2015-10-06 MED ORDER — TRAMADOL HCL 50 MG PO TABS: 50.0000 mg | ORAL_TABLET | Freq: Four times a day (QID) | ORAL | Status: DC | PRN

## 2015-10-06 MED ORDER — METHOCARBAMOL 500 MG PO TABS: 500.0000 mg | ORAL_TABLET | Freq: Two times a day (BID) | ORAL | Status: DC | PRN

## 2015-10-06 MED ORDER — HYDROCODONE-ACETAMINOPHEN 5-325 MG PO TABS
1.0000 | ORAL_TABLET | Freq: Once | ORAL | Status: AC
  Administered 2015-10-06: 1 via ORAL
  Filled 2015-10-06: qty 1

## 2015-10-06 NOTE — Discharge Instructions (Signed)
Begin performing gentle range of motion exercises in 1-2 days. Ice your shoulder throughout the day, using an ice pack for 20 minutes at a time every hour. Naproxen for mild/moderate pain. Pain medication only as needed for severe pain. Robaxin is your muscle relaxer. Muscle relaxer and pain medication can make you very drowsy - please do not drink alcohol, operate heavy machinery or drive on this medication. Call orthopedist to schedule a follow up appointment for recheck of ongoing shoulder pain in 1-2 weeks that can be canceled with a 24-48 hour notice if complete resolution of pain. Return to the ER for new or worsening symptoms, any additional concerns.

## 2015-10-06 NOTE — ED Notes (Signed)
Pt in c/o left shoulder pain after injuring it while throwing some wood yesterday, increased pain with movement, no distress noted

## 2015-10-06 NOTE — ED Provider Notes (Signed)
CSN: [id number]     Arrival date & time 10/06/15  1517 History  By signing my name below, I, Jerry Mitchell, attest that this documentation has been prepared under the direction and in the presence of non-physician practitioner, Jerry Sauer, PA-C. Electronically Signed: Linna Mitchell, Scribe. 10/06/2015. 4:23 PM.   Chief Complaint  Patient presents with  . Shoulder Injury   The history is provided by the patient. No language interpreter was used.    HPI Comments: Jerry Mitchell is a left-hand dominant 40 y.o. male who presents to the Emergency Department complaining of constant, worsening, anterior left shoulder pain beginning yesterday. Pt was throwing wood onto a roof yesterday (with his left arm) and notes that he has experienced gradually worsening left shoulder pain since. Pt endorses severe pain exacerbation with left arm raising as well as with palpation to his left shoulder. Pt took Tylenol for pain this morning with no relief. He has no h/o left shoulder injury. He denies numbness/tingling, neuro deficits, back pain, neck pain, or any other associated symptoms. Pt does not have an orthopedist or PCP.  Past Medical History  Diagnosis Date  . Hepatitis C    History reviewed. No pertinent past surgical history. History reviewed. No pertinent family history. Social History  Substance Use Topics  . Smoking status: Light Tobacco Smoker  . Smokeless tobacco: None  . Alcohol Use: Yes    Review of Systems  Musculoskeletal: Positive for arthralgias (left shoulder). Negative for back pain and neck pain.  Neurological: Negative for numbness.       Negative for sensation loss.    Allergies  Review of patient's allergies indicates no known allergies.  Home Medications   Prior to Admission medications   Medication Sig Start Date End Date Taking? Authorizing Provider  azithromycin (ZITHROMAX) 250 MG tablet Take 1 tablet (250 mg total) by mouth daily. Take once a day for 4 days starting  tomorrow (3/28). You received your first dose in the ED (3/27) 07/17/15   Jerry Barrett, PA-C  Chlorphen-Pseudoephed-APAP (THERAFLU FLU/COLD PO) Take 1 each by mouth every 6 (six) hours as needed (cold/flu symptoms).    Historical Provider, MD  DM-Doxylamine-Acetaminophen (NYQUIL COLD & FLU PO) Take 30 mLs by mouth every 8 (eight) hours as needed (cold symptoms).    Historical Provider, MD  methocarbamol (ROBAXIN) 500 MG tablet Take 1 tablet (500 mg total) by mouth 2 (two) times daily as needed for muscle spasms. 10/06/15   Jerry Picket Courtnee Myer, PA-C  naproxen (NAPROSYN) 500 MG tablet Take 1 tablet (500 mg total) by mouth 2 (two) times daily. 10/06/15   Jerry Picket Howie Rufus, PA-C  traMADol (ULTRAM) 50 MG tablet Take 1 tablet (50 mg total) by mouth every 6 (six) hours as needed. 10/06/15   Jerry Martorelli Pilcher Adeena Bernabe, PA-C   BP 122/76 mmHg  Pulse 70  Temp(Src) 98.8 F (37.1 C) (Oral)  Resp 14  Ht  (1.803 m)  Wt 92.987 kg  BMI 28.60 kg/m2  SpO2 100% Physical Exam  Constitutional: He is oriented to person, place, and time. He appears well-developed and well-nourished. No distress.  HENT:  Head: Normocephalic and atraumatic.  Neck:  No midline or paraspinal tenderness to palpation. Full ROM without pain.   Cardiovascular: Normal rate, regular rhythm and normal heart sounds.   Pulmonary/Chest: Effort normal and breath sounds normal. No respiratory distress.  Musculoskeletal:  Left shoulder : TTP of anterior shoulder, most tender over AC joint. Decreased ROM 2/2 pain. No swelling, erythema, or  ecchymosis present. No step-off, crepitus, or deformity appreciated. 5/5 muscle strength of LUE. 2+ radial pulse, sensation intact, all compartments soft.  Neurological: He is alert and oriented to person, place, and time.  Skin: Skin is warm and dry.  Psychiatric: He has a normal mood and affect. His behavior is normal.  Nursing note and vitals reviewed.   ED Course  Procedures  DIAGNOSTIC STUDIES: Oxygen  Saturation is 100% on RA, normal by my interpretation.    COORDINATION OF CARE: 4:23 PM Discussed next steps with pt. Pt verbalized understanding and is agreeable with the plan.   Labs Review Labs Reviewed - No data to display  Imaging Review Dg Shoulder Left  10/06/2015  CLINICAL DATA:  Left shoulder pain after throwing a heavy object. EXAM: LEFT SHOULDER - 2+ VIEW COMPARISON:  None. FINDINGS: There is no evidence of fracture or dislocation. There is no evidence of arthropathy or other focal bone abnormality. Soft tissues are unremarkable. IMPRESSION: Negative. Electronically Signed   By: Jerry Mcalpineobrinka  Mitchell M.D.   On: 10/06/2015 17:04   I have personally reviewed and evaluated these images and lab results as part of my medical decision-making.   EKG Interpretation None      MDM   Final diagnoses:  Left shoulder pain   Jerry Mitchell presents to ED for left shoulder pain after throwing wood yesterday. On exam, LUE in NVI. TTP of anterior shoulder esp. [facility]. X-rays reviewed which were unremarkable. Symptomatic home care instructions discussed. [place] controlled substance database consulted. Will treat with NSAID's, muscle relaxers, and short course pain meds. Ortho follow up strongly recommended. Offered sling, but patient declined. Basic ROM exercises discussed. All questions answered.   I personally performed the services described in this documentation, which was scribed in my presence. The recorded information has been reviewed and is accurate.   [facility] LLCJaime Pilcher Juniel Groene, PA-C 10/06/15 1842  Jerry DibblesJon Knapp, MD 10/06/15 [phone]

## 2015-11-17 ENCOUNTER — Encounter (HOSPITAL_COMMUNITY): Payer: Self-pay | Admitting: *Deleted

## 2015-11-17 ENCOUNTER — Inpatient Hospital Stay (HOSPITAL_COMMUNITY)
Admission: EM | Admit: 2015-11-17 | Discharge: 2015-11-19 | DRG: 683 | Disposition: A | Discharge status: Home / Self Care | Payer: Self-pay | Attending: Internal Medicine | Admitting: Internal Medicine

## 2015-11-17 DIAGNOSIS — F17200 Nicotine dependence, unspecified, uncomplicated: Secondary | ICD-10-CM | POA: Diagnosis present

## 2015-11-17 DIAGNOSIS — M25512 Pain in left shoulder: Secondary | ICD-10-CM | POA: Diagnosis present

## 2015-11-17 DIAGNOSIS — N179 Acute kidney failure, unspecified: Principal | ICD-10-CM | POA: Diagnosis present

## 2015-11-17 DIAGNOSIS — D649 Anemia, unspecified: Secondary | ICD-10-CM | POA: Diagnosis present

## 2015-11-17 DIAGNOSIS — B1920 Unspecified viral hepatitis C without hepatic coma: Secondary | ICD-10-CM | POA: Diagnosis present

## 2015-11-17 DIAGNOSIS — D696 Thrombocytopenia, unspecified: Secondary | ICD-10-CM

## 2015-11-17 DIAGNOSIS — R748 Abnormal levels of other serum enzymes: Secondary | ICD-10-CM | POA: Diagnosis present

## 2015-11-17 DIAGNOSIS — E871 Hypo-osmolality and hyponatremia: Secondary | ICD-10-CM | POA: Diagnosis present

## 2015-11-17 DIAGNOSIS — E876 Hypokalemia: Secondary | ICD-10-CM | POA: Diagnosis present

## 2015-11-17 DIAGNOSIS — B182 Chronic viral hepatitis C: Secondary | ICD-10-CM | POA: Diagnosis present

## 2015-11-17 DIAGNOSIS — E860 Dehydration: Secondary | ICD-10-CM | POA: Diagnosis present

## 2015-11-17 DIAGNOSIS — M6282 Rhabdomyolysis: Secondary | ICD-10-CM | POA: Diagnosis present

## 2015-11-17 LAB — COMPREHENSIVE METABOLIC PANEL
ALK PHOS: 77 U/L (ref 38–126)
ALT: 140 U/L — AB (ref 17–63)
AST: 101 U/L — AB (ref 15–41)
Albumin: 4.9 g/dL (ref 3.5–5.0)
Anion gap: 14 (ref 5–15)
BILIRUBIN TOTAL: 1.1 mg/dL (ref 0.3–1.2)
BUN: 35 mg/dL — AB (ref 6–20)
CALCIUM: 9.8 mg/dL (ref 8.9–10.3)
CHLORIDE: 93 mmol/L — AB (ref 101–111)
CO2: 24 mmol/L (ref 22–32)
CREATININE: 3.71 mg/dL — AB (ref 0.61–1.24)
GFR, EST AFRICAN AMERICAN: 22 mL/min — AB (ref >60)
GFR, EST NON AFRICAN AMERICAN: 19 mL/min — AB (ref >60)
Glucose, Bld: 101 mg/dL — ABNORMAL HIGH (ref 65–99)
Potassium: 4.6 mmol/L (ref 3.5–5.1)
Sodium: 131 mmol/L — ABNORMAL LOW (ref 135–145)
Total Protein: 8.2 g/dL — ABNORMAL HIGH (ref 6.5–8.1)

## 2015-11-17 LAB — LIPASE, BLOOD: LIPASE: 29 U/L (ref 11–51)

## 2015-11-17 LAB — URINALYSIS, ROUTINE W REFLEX MICROSCOPIC
GLUCOSE, UA: NEGATIVE mg/dL
HGB URINE DIPSTICK: NEGATIVE
KETONES UR: 15 mg/dL — AB
Nitrite: NEGATIVE
PROTEIN: 30 mg/dL — AB
Specific Gravity, Urine: 1.026 (ref 1.005–1.030)
pH: 5 (ref 5.0–8.0)

## 2015-11-17 LAB — CBC
HCT: 39.1 % (ref 39.0–52.0)
Hemoglobin: 13.8 g/dL (ref 13.0–17.0)
MCH: 31 pg (ref 26.0–34.0)
MCHC: 35.3 g/dL (ref 30.0–36.0)
MCV: 87.9 fL (ref 78.0–100.0)
PLATELETS: 205 10*3/uL (ref 150–400)
RBC: 4.45 MIL/uL (ref 4.22–5.81)
RDW: 13 % (ref 11.5–15.5)
WBC: 9.8 10*3/uL (ref 4.0–10.5)

## 2015-11-17 LAB — URINE MICROSCOPIC-ADD ON: RBC / HPF: NONE SEEN RBC/hpf (ref 0–5)

## 2015-11-17 LAB — CK: CK TOTAL: 425 U/L — AB (ref 49–397)

## 2015-11-17 MED ORDER — SODIUM CHLORIDE 0.9 % IV BOLUS (SEPSIS)
1000.0000 mL | Freq: Once | INTRAVENOUS | Status: AC
  Administered 2015-11-17: 1000 mL via INTRAVENOUS

## 2015-11-17 MED ORDER — ONDANSETRON HCL 4 MG/2ML IJ SOLN
4.0000 mg | Freq: Four times a day (QID) | INTRAMUSCULAR | Status: DC | PRN
  Administered 2015-11-18: 4 mg via INTRAVENOUS
  Filled 2015-11-17: qty 2

## 2015-11-17 MED ORDER — MORPHINE SULFATE (PF) 4 MG/ML IV SOLN
4.0000 mg | Freq: Once | INTRAVENOUS | Status: AC
Start: 2015-11-18 — End: 2015-11-18
  Administered 2015-11-18: 4 mg via INTRAVENOUS
  Filled 2015-11-17: qty 1

## 2015-11-17 MED ORDER — SODIUM CHLORIDE 0.9 % IV BOLUS (SEPSIS)
1000.0000 mL | Freq: Once | INTRAVENOUS | Status: AC
  Administered 2015-11-18: 1000 mL via INTRAVENOUS

## 2015-11-17 NOTE — ED Triage Notes (Signed)
Pt reports working out in the heat today and has cramping to entire body including abd. Pt thinks he is dehydrated. Denies n/v/d.

## 2015-11-17 NOTE — ED Provider Notes (Signed)
[facility]-EMERGENCY DEPT Provider Note   CSN: [id number] Arrival date & time: 11/17/15  9562  First Provider Contact:  None       History   Chief Complaint Chief Complaint  Patient presents with  . Dehydration    HPI Jerry Mitchell is a 40 y.o. male.  [name] is a 40 y.o. male with history of hepatitis C and prior dehydration, presents emergency department complaining of cramping. 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 He states he started cramping yesterday afternoon. He reports cramps and entire body. He denies any nausea or vomiting. No headache. No visual changes. Denies any urinary changes. He reports similar presentation one year ago when was admitted for rhabdomyolysis. He has not tried any medications for this. He states he tried to drink fluids. He states today cramping has worsened so he came to the ER.        Past Medical History:  Diagnosis Date  . Hepatitis C     Patient Active Problem List   Diagnosis Date Noted  . AKI (acute kidney injury) (HCC) 12/22/2014  . Elevated liver enzymes 12/22/2014  . Elevated fasting glucose 12/22/2014  . Rhabdomyolysis 12/21/2014    History reviewed. No pertinent surgical history.     Home Medications    Prior to Admission medications   Medication Sig Start Date End Date Taking? Authorizing Provider  azithromycin (ZITHROMAX) 250 MG tablet Take 1 tablet (250 mg total) by mouth daily. Take once a day for 4 days starting tomorrow (3/28). You received your first dose in the ED (3/27) 07/17/15   Stevi Barrett, PA-C  Chlorphen-Pseudoephed-APAP (THERAFLU FLU/COLD PO) Take 1 each by mouth every 6 (six) hours as needed (cold/flu symptoms).    Historical Provider, MD  DM-Doxylamine-Acetaminophen (NYQUIL COLD & FLU PO) Take 30 mLs by mouth every 8 (eight) hours as needed (cold symptoms).    Historical Provider, MD  methocarbamol (ROBAXIN) 500 MG tablet Take 1 tablet (500 mg total) by mouth 2  (two) times daily as needed for muscle spasms. 10/06/15   Chase Picket Ward, PA-C  naproxen (NAPROSYN) 500 MG tablet Take 1 tablet (500 mg total) by mouth 2 (two) times daily. 10/06/15   Chase Picket Ward, PA-C  traMADol (ULTRAM) 50 MG tablet Take 1 tablet (50 mg total) by mouth every 6 (six) hours as needed. 10/06/15   Chase Picket Ward, PA-C    Family History History reviewed. No pertinent family history.  Social History Social History  Substance Use Topics  . Smoking status: Light Tobacco Smoker  . Smokeless tobacco: Not on file  . Alcohol use Yes     Allergies   Review of patient's allergies indicates no known allergies.   Review of Systems Review of Systems  Constitutional: Negative for chills and fever.  Respiratory: Negative for cough, chest tightness and shortness of breath.   Cardiovascular: Negative for chest pain, palpitations and leg swelling.  Gastrointestinal: Negative for abdominal distention, abdominal pain, diarrhea, nausea and vomiting.  Genitourinary: Negative for dysuria, frequency, hematuria and urgency.  Musculoskeletal: Positive for arthralgias and myalgias. Negative for neck pain and neck stiffness.  Skin: Negative for rash.  Allergic/Immunologic: Negative for immunocompromised state.  Neurological: Negative for dizziness, weakness, light-headedness, numbness and headaches.  All other systems reviewed and are negative.    Physical Exam Updated Vital Signs BP 150/93   Pulse 75   Temp 98.2 F (36.8 C) (Oral)   Resp 18   Ht  5\' 11"  (1.803 m)   Wt 92.1 kg   SpO2 99%   BMI 28.33 kg/m   Physical Exam  Constitutional: He appears well-developed and well-nourished. No distress.  HENT:  Head: Normocephalic and atraumatic.  Eyes: Conjunctivae are normal.  Neck: Neck supple.  Cardiovascular: Normal rate, regular rhythm and normal heart sounds.   Pulmonary/Chest: Effort normal. No respiratory distress. He has no wheezes. He has no rales.  Abdominal:  Soft. Bowel sounds are normal. He exhibits no distension. There is no tenderness. There is no rebound.  Musculoskeletal: He exhibits no edema.  Intact and equal bilateral radial and dp pulses  Neurological: He is alert.  Skin: Skin is warm and dry. Capillary refill takes less than 2 seconds.  Psychiatric: He has a normal mood and affect.  Nursing note and vitals reviewed.    ED Treatments / Results  Labs (all labs ordered are listed, but only abnormal results are displayed) Labs Reviewed  COMPREHENSIVE METABOLIC PANEL - Abnormal; Notable for the following:       Result Value   Sodium 131 (*)    Chloride 93 (*)    Glucose, Bld 101 (*)    BUN 35 (*)    Creatinine, Ser 3.71 (*)    Total Protein 8.2 (*)    AST 101 (*)    ALT 140 (*)    GFR calc non Af Amer 19 (*)    GFR calc Af Amer 22 (*)    All other components within normal limits  URINALYSIS, ROUTINE W REFLEX MICROSCOPIC (NOT AT [facility]) - Abnormal; Notable for the following:    Color, Urine AMBER (*)    APPearance HAZY (*)    Bilirubin Urine MODERATE (*)    Ketones, ur 15 (*)    Protein, ur 30 (*)    Leukocytes, UA SMALL (*)    All other components within normal limits  URINE MICROSCOPIC-ADD ON - Abnormal; Notable for the following:    Squamous Epithelial / LPF 0-5 (*)    Bacteria, UA FEW (*)    Casts HYALINE CASTS (*)    Crystals CA OXALATE CRYSTALS (*)    All other components within normal limits  CK - Abnormal; Notable for the following:    Total CK 425 (*)    All other components within normal limits  LIPASE, BLOOD  CBC    EKG  EKG Interpretation None       Radiology No results found.  Procedures Procedures (including critical care time)  Medications Ordered in ED Medications  sodium chloride 0.9 % bolus 1,000 mL (not administered)  sodium chloride 0.9 % bolus 1,000 mL (1,000 mLs Intravenous New Bag/Given 11/17/15 2303)  sodium chloride 0.9 % bolus 1,000 mL (1,000 mLs Intravenous New Bag/Given 11/17/15  2302)     Initial Impression / Assessment and Plan / ED Course  I have reviewed the triage vital signs and the nursing notes.  Pertinent labs & imaging results that were available during my care of the patient were reviewed by me and considered in my medical decision making (see chart for details).  Clinical Course  Comment By Time  Patient in the emergency department with dehydration, cramping to entire body. He appears to be comfortable. Vital signs are normal. He has history of the same one year ago was admitted for rhabdomyolysis. Today creatinine is up at 3.71. CK is 425. IV fluids started. Will admit Jaynie Crumble, PA-C 07/28 2331  Spoke with [facility], they will admit pt [place]  Erline Levine 07/28 2332     Final Clinical Impressions(s) / ED Diagnoses   Final diagnoses:  Acute kidney injury [facility])  Non-traumatic rhabdomyolysis    New Prescriptions New Prescriptions   No medications on file     Jaynie Crumble, PA-C 11/17/15 2335    Nelva Nay, MD 11/23/15 [phone]

## 2015-11-18 ENCOUNTER — Inpatient Hospital Stay (HOSPITAL_COMMUNITY): Payer: Self-pay

## 2015-11-18 ENCOUNTER — Encounter (HOSPITAL_COMMUNITY): Payer: Self-pay

## 2015-11-18 DIAGNOSIS — N179 Acute kidney failure, unspecified: Principal | ICD-10-CM

## 2015-11-18 LAB — COMPREHENSIVE METABOLIC PANEL
ALBUMIN: 3.4 g/dL — AB (ref 3.5–5.0)
ALT: 93 U/L — ABNORMAL HIGH (ref 17–63)
AST: 54 U/L — AB (ref 15–41)
Alkaline Phosphatase: 64 U/L (ref 38–126)
Anion gap: 8 (ref 5–15)
BUN: 32 mg/dL — AB (ref 6–20)
CHLORIDE: 104 mmol/L (ref 101–111)
CO2: 25 mmol/L (ref 22–32)
Calcium: 8.4 mg/dL — ABNORMAL LOW (ref 8.9–10.3)
Creatinine, Ser: 2.58 mg/dL — ABNORMAL HIGH (ref 0.61–1.24)
GFR calc Af Amer: 34 mL/min — ABNORMAL LOW (ref >60)
GFR calc non Af Amer: 30 mL/min — ABNORMAL LOW (ref >60)
GLUCOSE: 106 mg/dL — AB (ref 65–99)
POTASSIUM: 3.4 mmol/L — AB (ref 3.5–5.1)
SODIUM: 137 mmol/L (ref 135–145)
Total Bilirubin: 0.6 mg/dL (ref 0.3–1.2)
Total Protein: 6.9 g/dL (ref 6.5–8.1)

## 2015-11-18 LAB — CBC WITH DIFFERENTIAL/PLATELET
BASOS ABS: 0 10*3/uL (ref 0.0–0.1)
Basophils Relative: 0 %
EOS PCT: 3 %
Eosinophils Absolute: 0.3 10*3/uL (ref 0.0–0.7)
HCT: 34.3 % — ABNORMAL LOW (ref 39.0–52.0)
Hemoglobin: 11.7 g/dL — ABNORMAL LOW (ref 13.0–17.0)
Lymphocytes Relative: 40 %
Lymphs Abs: 2.9 10*3/uL (ref 0.7–4.0)
MCH: 30.2 pg (ref 26.0–34.0)
MCHC: 34.1 g/dL (ref 30.0–36.0)
MCV: 88.4 fL (ref 78.0–100.0)
MONO ABS: 0.8 10*3/uL (ref 0.1–1.0)
Monocytes Relative: 12 %
Neutro Abs: 3.3 10*3/uL (ref 1.7–7.7)
Neutrophils Relative %: 45 %
PLATELETS: 162 10*3/uL (ref 150–400)
RBC: 3.88 MIL/uL — ABNORMAL LOW (ref 4.22–5.81)
RDW: 13.3 % (ref 11.5–15.5)
WBC: 7.3 10*3/uL (ref 4.0–10.5)

## 2015-11-18 LAB — PROTIME-INR
INR: 1.22
PROTHROMBIN TIME: 15.5 s — AB (ref 11.4–15.2)

## 2015-11-18 LAB — NA AND K (SODIUM & POTASSIUM), RAND UR
POTASSIUM UR: 14 mmol/L
Sodium, Ur: 63 mmol/L

## 2015-11-18 LAB — MAGNESIUM: Magnesium: 2 mg/dL (ref 1.7–2.4)

## 2015-11-18 LAB — OSMOLALITY, URINE: OSMOLALITY UR: 336 mosm/kg (ref 300–900)

## 2015-11-18 LAB — CK: CK TOTAL: 334 U/L (ref 49–397)

## 2015-11-18 LAB — PHOSPHORUS: PHOSPHORUS: 4.9 mg/dL — AB (ref 2.5–4.6)

## 2015-11-18 MED ORDER — SODIUM CHLORIDE 0.9% FLUSH
3.0000 mL | Freq: Two times a day (BID) | INTRAVENOUS | Status: DC
  Administered 2015-11-19: 3 mL via INTRAVENOUS

## 2015-11-18 MED ORDER — OXYCODONE HCL 5 MG PO TABS
5.0000 mg | ORAL_TABLET | Freq: Four times a day (QID) | ORAL | Status: DC | PRN
  Administered 2015-11-18: 5 mg via ORAL
  Filled 2015-11-18 (×2): qty 1

## 2015-11-18 MED ORDER — ENOXAPARIN SODIUM 40 MG/0.4ML ~~LOC~~ SOLN: 40.0000 mg | SUBCUTANEOUS | Status: DC

## 2015-11-18 MED ORDER — POTASSIUM CHLORIDE CRYS ER 20 MEQ PO TBCR
40.0000 meq | EXTENDED_RELEASE_TABLET | Freq: Once | ORAL | Status: AC
  Administered 2015-11-18: 40 meq via ORAL
  Filled 2015-11-18: qty 2

## 2015-11-18 MED ORDER — CALCIUM CARBONATE ANTACID 500 MG PO CHEW
1.0000 | CHEWABLE_TABLET | Freq: Four times a day (QID) | ORAL | Status: DC | PRN
  Administered 2015-11-18: 200 mg via ORAL
  Filled 2015-11-18 (×2): qty 1

## 2015-11-18 MED ORDER — ENOXAPARIN SODIUM 30 MG/0.3ML ~~LOC~~ SOLN
30.0000 mg | SUBCUTANEOUS | Status: DC
  Administered 2015-11-18: 30 mg via SUBCUTANEOUS
  Filled 2015-11-18: qty 0.3

## 2015-11-18 MED ORDER — SODIUM CHLORIDE 0.9 % IV SOLN
INTRAVENOUS | Status: DC
  Administered 2015-11-18 – 2015-11-19 (×3): via INTRAVENOUS

## 2015-11-18 MED ORDER — CYCLOBENZAPRINE HCL 10 MG PO TABS
10.0000 mg | ORAL_TABLET | Freq: Three times a day (TID) | ORAL | Status: DC | PRN
  Administered 2015-11-18: 10 mg via ORAL
  Filled 2015-11-18: qty 1

## 2015-11-18 NOTE — H&P (Signed)
History and Physical    Jerry Mitchell VXU:[id number] DOB: 1975-10-29 DOA: 11/17/2015  PCP: No PCP Per Patient   Patient coming from: Home.  Chief Complaint: Body cramping.  HPI: Jerry Mitchell is a 40 y.o. 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  Per patient, his cramping started yesterday afternoon. He states that he tried to rehydrate himself by drinking fluids, but then today the cramping worsened. He denies fever, chills, nausea, emesis, diarrhea, dysuria or hematuria, but reports decreased urine volume and darkening of the urine. He is states that he had a similar episode last year when he was admitted for rhabdomyolysis.   ED Course: The patient received IV fluids. Creatinine level was 1.07 mg/dL in March and has increased to 3.71 mg/dL today. Urine shows some proteinuria, but no hemoglobin or RBCs. Total CKs was 425 U/L.  Review of Systems: As per HPI otherwise 10 point review of systems negative.    Past Medical History:  Diagnosis Date  . Hepatitis C     History reviewed. No pertinent surgical history.   reports that he has been smoking.  He has never used smokeless tobacco. He reports that he drinks alcohol. He reports that he does not use drugs.  No Known Allergies  History reviewed. No pertinent family history.   Prior to Admission medications   Not on File    Physical Exam: Vitals:   11/17/15 2345 11/18/15 0015 11/18/15 0038 11/18/15 0040  BP: 111/65 134/78 120/69   Pulse: 70 72 71   Resp:   19   Temp:    98.1 F (36.7 C)  TempSrc:    Oral  SpO2: 99% 98% 99%   Weight:    96.4 kg (212 lb 8 oz)  Height:    5\' 11"  (1.803 m)      Constitutional: NAD, calm, comfortable Vitals:   11/17/15 2345 11/18/15 0015 11/18/15 0038 11/18/15 0040  BP: 111/65 134/78 120/69     Pulse: 70 72 71   Resp:   19   Temp:    98.1 F (36.7 C)  TempSrc:    Oral  SpO2: 99% 98% 99%   Weight:    96.4 kg (212 lb 8 oz)  Height:    5\' 11"  (1.803 m)   Eyes: PERRL, lids and conjunctivae normal ENMT: Mucous membranes are moist. Posterior pharynx clear of any exudate or lesions. Neck: normal, supple, no masses, no thyromegaly Respiratory: clear to auscultation bilaterally, no wheezing, no crackles. Normal respiratory effort. No accessory muscle use.  Cardiovascular: Regular rate and rhythm, no murmurs / rubs / gallops. No extremity edema. 2+ pedal pulses. No carotid bruits.  Abdomen: Mild diffuse tenderness, no masses palpated. No hepatosplenomegaly. Bowel sounds positive.  Musculoskeletal: no clubbing / cyanosis. No joint deformity upper and lower extremities. Good ROM, no contractures. Increased muscle tone.  Skin: no rashes, lesions, ulcers. No induration Neurologic: CN 2-12 grossly intact. Sensation intact, DTR normal. Strength 5/5 in all 4.  Psychiatric: Normal judgment and insight. Alert and oriented x 4. Normal mood.    Labs on Admission: I have personally reviewed following labs and imaging studies  CBC:  Recent Labs Lab 11/17/15 1850  WBC 9.8  HGB 13.8  HCT 39.1  MCV 87.9  PLT 205   Basic Metabolic Panel:  Recent Labs Lab 11/17/15  1850  NA 131*  K 4.6  CL 93*  CO2 24  GLUCOSE 101*  BUN 35*  CREATININE 3.71*  CALCIUM 9.8   GFR: Estimated Creatinine Clearance: 31.6 mL/min (by C-G formula based on SCr of 3.71 mg/dL). Liver Function Tests:  Recent Labs Lab 11/17/15 1850  AST 101*  ALT 140*  ALKPHOS 77  BILITOT 1.1  PROT 8.2*  ALBUMIN 4.9    Recent Labs Lab 11/17/15 1850  LIPASE 29   Cardiac Enzymes:  Recent Labs Lab 11/17/15 1850  CKTOTAL 425*   Urine analysis:    Component Value Date/Time   COLORURINE AMBER (A) 11/17/2015 1852   APPEARANCEUR HAZY (A) 11/17/2015 1852   LABSPEC 1.026 11/17/2015 1852   PHURINE 5.0 11/17/2015  1852   GLUCOSEU NEGATIVE 11/17/2015 1852   HGBUR NEGATIVE 11/17/2015 1852   BILIRUBINUR MODERATE (A) 11/17/2015 1852   KETONESUR 15 (A) 11/17/2015 1852   PROTEINUR 30 (A) 11/17/2015 1852   UROBILINOGEN 1.0 12/21/2014 1630   NITRITE NEGATIVE 11/17/2015 1852   LEUKOCYTESUR SMALL (A) 11/17/2015 1852      Assessment/Plan Principal Problem:   AKI (acute kidney injury) (HCC) Likely secondary to dehydration. Admit to telemetry/inpatient. Continue IV hydration with normal saline at 150 miles per hour. Follow-up electrolytes and renal function closely. Given low level of total CK and history of chronic hep C, I will check ANA and RF levels. Consider checking cryoglobulin and total complement levels if worsening renal function or suspicion of hepatitis C related renal disease.  Active Problems:   Hyponatremia Check urine sodium and osmolality.    Rhabdomyolysis Continue IV hydration. Muscle relaxants as needed for spasms. Check total CK level in the morning.    Elevated liver enzymes   Hepatitis C Per patient, he will seek treatment for hepatitis C once he has health insurance.   DVT prophylaxis: Lovenox. Code Status: Full code. Family Communication:  Disposition Plan: Admit for IV fluid challenge and further workup. Consults called:  Admission status: Inpatient/telemetry   Bobette Mo MD [facility] Pager [phone]  If 7PM-7AM, please contact night-coverage [email or web address] Password [email or web address]  11/18/2015, 1:31 AM

## 2015-11-18 NOTE — Progress Notes (Signed)
NURSING PROGRESS NOTE  [name] [id number] Admission Data: 11/18/2015 1:51 AM Attending Provider: Bobette Mo, MD PCP:No PCP Per Patient Code Status: FULL  Allergies:  Review of patient's allergies indicates no known allergies. Past Medical History:   has a past medical history of Hepatitis C. Past Surgical History:   has no past surgical history on file. Social History:   reports that he has been smoking.  He has never used smokeless tobacco. He reports that he drinks alcohol. He reports that he does not use drugs.  Jerry Mitchell is a 40 y.o. male patient admitted from ED:   Last Documented Vital Signs: Blood pressure 120/69, pulse 71, temperature 98.1 F (36.7 C), temperature source Oral, resp. rate 19, height 5\' 11"  (1.803 m), weight 96.4 kg (212 lb 8 oz), SpO2 99 %.  Cardiac Monitoring: Box # 18 in place. Cardiac monitor yields:normal sinus rhythm.  IV Fluids:  IV in place, occlusive dsg intact without redness, IV cath forearm left, condition patent and no redness normal saline.   Skin: Appropriate for ethnicity with various tattoos and scars. Intact  Patient/Family orientated to room. Information packet given to patient/family. Admission inpatient armband information verified with patient/family to include name and date of birth and placed on patient arm. Side rails up x 2, fall assessment and education completed with patient/family. Patient/family able to verbalize understanding of risk associated with falls and verbalized understanding to call for assistance before getting out of bed. Call light within reach. Patient/family able to voice and demonstrate understanding of unit orientation instructions.    Will continue to evaluate and treat per MD orders.  Sue Lush RN, BSN

## 2015-11-18 NOTE — Progress Notes (Signed)
Received report from ED RN, Jennifer. 

## 2015-11-18 NOTE — Progress Notes (Signed)
PROGRESS NOTE  Jerry Mitchell  IRJ:[id number] DOB: December 22, 1975 DOA: 11/17/2015 PCP: No PCP Per Patient  Brief Narrative:   Jerry Mitchell is a 40 y.o. 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  Creatinine level was 1.07 mg/dL in March and was 3.01 mg/dL on admission. Urine shows some proteinuria, but no hemoglobin or RBCs. Total CKs was 425 U/L.  Started on IVF.    Assessment & Plan:   Principal Problem:   AKI (acute kidney injury) (HCC) Active Problems:   Rhabdomyolysis   Elevated liver enzymes   Hepatitis C   Hyponatremia   AKI (acute kidney injury) (HCC), resolving with IVF -  Add urine creatinine to check FENa -  RUS without evidence of obstruction -  Continue IVF -  Repeat creatinine in AM -  ANA and RF levels pending  Active Problems:   Hyponatremia due to dehydration, resolved with IVF   Mild Rhabdomyolysis, CK trending down with IVF.    Elevated liver enzymes likely due to rhabdo and chronic HCV.  Improved with IVF.  Repeat LFTs in AM    Hepatitis C Per patient, he will seek treatment for hepatitis C once he has health insurance.  Mild dilutional anemia.  Repeat CBC as outpatient  Mild hypokalemia, oral potassium repletion  DVT prophylaxis:  SCDs Code Status:  full Family Communication:  Patient and his wife Disposition Plan:  Likely home tomorrow if creatinine continues to improve   Consultants:   none  Procedures:  none  Antimicrobials:   none    Subjective: Feeling much better.  Hand, stomach and leg cramping have all improved.  Objective: Vitals:   11/18/15 0038 11/18/15 0040 11/18/15 0458 11/18/15 0500  BP: 120/69  (!) 100/49   Pulse: 71  (!) 53   Resp: 19  18   Temp:  98.1 F (36.7 C) 98.2 F (36.8 C)   TempSrc:  Oral    SpO2: 99%  98%     Weight:  96.4 kg (212 lb 8 oz)  96.4 kg (212 lb 8 oz)  Height:  5\' 11"  (1.803 m)      Intake/Output Summary (Last 24 hours) at 11/18/15 1423 Last data filed at 11/18/15 0835  Gross per 24 hour  Intake              240 ml  Output              675 ml  Net             -435 ml   Filed Weights   11/17/15 1845 11/18/15 0040 11/18/15 0500  Weight: 92.1 kg (203 lb 2 oz) 96.4 kg (212 lb 8 oz) 96.4 kg (212 lb 8 oz)    Examination:  General exam:  Adult male.  No acute distress.  HEENT:  NCAT, MMM Respiratory system: Clear to auscultation bilaterally Cardiovascular system: Regular rate and rhythm, normal S1/S2. No murmurs, rubs, gallops or clicks.  Warm extremities Gastrointestinal system: Normal active bowel sounds, soft, nondistended, nontender. MSK:  Normal tone and bulk, no lower extremity edema Neuro:  Grossly intact    Data Reviewed: I have personally reviewed following labs and imaging studies  CBC:  Recent Labs Lab 11/17/15 1850 11/18/15 0417  WBC 9.8 7.3  NEUTROABS  --  3.3  HGB 13.8 11.7*  HCT 39.1 34.3*  MCV 87.9 88.4  PLT 205 162   Basic Metabolic Panel:  Recent Labs Lab 11/17/15 1850 11/18/15 0417  NA 131* 137  K 4.6 3.4*  CL 93* 104  CO2 24 25  GLUCOSE 101* 106*  BUN 35* 32*  CREATININE 3.71* 2.58*  CALCIUM 9.8 8.4*  MG  --  2.0  PHOS  --  4.9*   GFR: Estimated Creatinine Clearance: 45.5 mL/min (by C-G formula based on SCr of 2.58 mg/dL). Liver Function Tests:  Recent Labs Lab 11/17/15 1850 11/18/15 0417  AST 101* 54*  ALT 140* 93*  ALKPHOS 77 64  BILITOT 1.1 0.6  PROT 8.2* 6.9  ALBUMIN 4.9 3.4*    Recent Labs Lab 11/17/15 1850  LIPASE 29   No results for input(s): AMMONIA in the last 168 hours. Coagulation Profile:  Recent Labs Lab 11/18/15 0417  INR 1.22   Cardiac Enzymes:  Recent Labs Lab 11/17/15 1850 11/18/15 0417  CKTOTAL 425* 334   BNP (last 3 results) No results for input(s): PROBNP in the last 8760  hours. HbA1C: No results for input(s): HGBA1C in the last 72 hours. CBG: No results for input(s): GLUCAP in the last 168 hours. Lipid Profile: No results for input(s): CHOL, HDL, LDLCALC, TRIG, CHOLHDL, LDLDIRECT in the last 72 hours. Thyroid Function Tests: No results for input(s): TSH, T4TOTAL, FREET4, T3FREE, THYROIDAB in the last 72 hours. Anemia Panel: No results for input(s): VITAMINB12, FOLATE, FERRITIN, TIBC, IRON, RETICCTPCT in the last 72 hours. Urine analysis:    Component Value Date/Time   COLORURINE AMBER (A) 11/17/2015 1852   APPEARANCEUR HAZY (A) 11/17/2015 1852   LABSPEC 1.026 11/17/2015 1852   PHURINE 5.0 11/17/2015 1852   GLUCOSEU NEGATIVE 11/17/2015 1852   HGBUR NEGATIVE 11/17/2015 1852   BILIRUBINUR MODERATE (A) 11/17/2015 1852   KETONESUR 15 (A) 11/17/2015 1852   PROTEINUR 30 (A) 11/17/2015 1852   UROBILINOGEN 1.0 12/21/2014 1630   NITRITE NEGATIVE 11/17/2015 1852   LEUKOCYTESUR SMALL (A) 11/17/2015 1852   Sepsis Labs: (procalcitonin:4,lacticidven:4)  )No results found for this or any previous visit (from the past 240 hour(s)).    Radiology Studies: US Renal  Result Date: 11/18/2015 CLINICAL DATA:  Acute renal failure. EXAM: RENAL / URINARY TRACT ULTRASOUND COMPLETE COMPARISON:  None. FINDINGS: Right Kidney: Length: 11.2 cm. Echogenicity within normal limits. No mass or hydronephrosis visualized. Left Kidney: Length: 12.6 cm. Two 3 cm cysts are seen in the left kidney. No hydronephrosis. Bladder: Appears normal for degree of bladder distention. A mass is seen in the spleen measuring 3.2 x 3.1 x 2.7 cm, heterogeneous in echogenic texture. A small accessory spleen is identified. The spleen is enlarged measuring 13.1 cm in greatest dimension with a volume of 777 cc. IMPRESSION: 1. 3.2 cm mass in the spleen, nonspecific, but likely benign. Recommend comparison to previous outside studies if available. 2. Splenomegaly. 3. Two left renal cysts. 4. No cause  for acute renal failure/injury. Electronically Signed   By: Gerome Sam III M.D   On: 11/18/2015 10:45    Scheduled Meds: . enoxaparin (LOVENOX) injection  30 mg Subcutaneous Q24H  . sodium chloride flush  3 mL Intravenous Q12H   Continuous Infusions: . sodium chloride 150 mL/hr at 11/18/15 0640     LOS: 1 day    Time spent: 30 min    Renae Fickle, MD [facility] Pager [phone]  If 7PM-7AM, please contact night-coverage [email or web address] Password [facility] 11/18/2015, 2:23 PM

## 2015-11-19 DIAGNOSIS — D696 Thrombocytopenia, unspecified: Secondary | ICD-10-CM

## 2015-11-19 DIAGNOSIS — R748 Abnormal levels of other serum enzymes: Secondary | ICD-10-CM

## 2015-11-19 DIAGNOSIS — E871 Hypo-osmolality and hyponatremia: Secondary | ICD-10-CM

## 2015-11-19 DIAGNOSIS — B1920 Unspecified viral hepatitis C without hepatic coma: Secondary | ICD-10-CM

## 2015-11-19 LAB — CREATININE, URINE, RANDOM: CREATININE, URINE: 103.21 mg/dL

## 2015-11-19 LAB — COMPREHENSIVE METABOLIC PANEL
ALK PHOS: 47 U/L (ref 38–126)
ALT: 96 U/L — AB (ref 17–63)
AST: 65 U/L — AB (ref 15–41)
Albumin: 2.9 g/dL — ABNORMAL LOW (ref 3.5–5.0)
Anion gap: 3 — ABNORMAL LOW (ref 5–15)
BUN: 16 mg/dL (ref 6–20)
CALCIUM: 8.4 mg/dL — AB (ref 8.9–10.3)
CHLORIDE: 111 mmol/L (ref 101–111)
CO2: 24 mmol/L (ref 22–32)
CREATININE: 1.07 mg/dL (ref 0.61–1.24)
Glucose, Bld: 89 mg/dL (ref 65–99)
Potassium: 4.8 mmol/L (ref 3.5–5.1)
Sodium: 138 mmol/L (ref 135–145)
Total Bilirubin: 1 mg/dL (ref 0.3–1.2)
Total Protein: 5.8 g/dL — ABNORMAL LOW (ref 6.5–8.1)

## 2015-11-19 LAB — CBC WITH DIFFERENTIAL/PLATELET
BASOS ABS: 0 10*3/uL (ref 0.0–0.1)
Basophils Relative: 0 %
EOS PCT: 4 %
Eosinophils Absolute: 0.2 10*3/uL (ref 0.0–0.7)
HCT: 33.5 % — ABNORMAL LOW (ref 39.0–52.0)
HEMOGLOBIN: 11.3 g/dL — AB (ref 13.0–17.0)
LYMPHS ABS: 2 10*3/uL (ref 0.7–4.0)
LYMPHS PCT: 43 %
MCH: 30.5 pg (ref 26.0–34.0)
MCHC: 33.7 g/dL (ref 30.0–36.0)
MCV: 90.3 fL (ref 78.0–100.0)
Monocytes Absolute: 0.5 10*3/uL (ref 0.1–1.0)
Monocytes Relative: 11 %
NEUTROS ABS: 1.9 10*3/uL (ref 1.7–7.7)
NEUTROS PCT: 42 %
PLATELETS: 120 10*3/uL — AB (ref 150–400)
RBC: 3.71 MIL/uL — AB (ref 4.22–5.81)
RDW: 13.3 % (ref 11.5–15.5)
WBC: 4.6 10*3/uL (ref 4.0–10.5)

## 2015-11-19 LAB — RHEUMATOID FACTOR: Rhuematoid fact SerPl-aCnc: <10 IU/mL (ref 0.0–13.9)

## 2015-11-19 NOTE — Discharge Instructions (Signed)
Acute Kidney Injury °Acute kidney injury is any condition in which there is sudden (acute) damage to the kidneys. Acute kidney injury was previously known as acute kidney failure or acute renal failure. The kidneys are two organs that lie on either side of the spine between the middle of the back and the front of the abdomen. The kidneys: °· Remove wastes and extra water from the blood.   °· Produce important hormones. These help keep bones strong, regulate blood pressure, and help create red blood cells.   °· Balance the fluids and chemicals in the blood and tissues. °A small amount of kidney damage may not cause problems, but a large amount of damage may make it difficult or impossible for the kidneys to work the way they should. Acute kidney injury may develop into long-lasting (chronic) kidney disease. It may also develop into a life-threatening disease called end-stage kidney disease. Acute kidney injury can get worse very quickly, so it should be treated right away. Early treatment may prevent other kidney diseases from developing. °CAUSES  °· A problem with blood flow to the kidneys. This may be caused by:   °¨ Blood loss.   °¨ Heart disease.   °¨ Severe burns.   °¨ Liver disease. °· Direct damage to the kidneys. This may be caused by: °¨ Some medicines.   °¨ A kidney infection.   °¨ Poisoning or consuming toxic substances.   °¨ A surgical wound.   °¨ A blow to the kidney area.   °· A problem with urine flow. This may be caused by:   °¨ Cancer.   °¨ Kidney stones.   °¨ An enlarged prostate. °SIGNS AND SYMPTOMS  °· Swelling (edema) of the legs, ankles, or feet.   °· Tiredness (lethargy).   °· Nausea or vomiting.   °· Confusion.   °· Problems with urination, such as:   °¨ Painful or burning feeling during urination.   °¨ Decreased urine production.   °¨ Frequent accidents in children who are potty trained.   °¨ Bloody urine.   °· Muscle twitches and cramps.   °· Shortness of breath.   °· Seizures.   °· Chest  pain or pressure. °Sometimes, no symptoms are present.  °DIAGNOSIS °Acute kidney injury may be detected and diagnosed by tests, including blood, urine, imaging, or kidney biopsy tests.  °TREATMENT °Treatment of acute kidney injury varies depending on the cause and severity of the kidney damage. In mild cases, no treatment may be needed. The kidneys may heal on their own. If acute kidney injury is more severe, your health care provider will treat the cause of the kidney damage, help the kidneys heal, and prevent complications from occurring. Severe cases may require a procedure to remove toxic wastes from the body (dialysis) or surgery to repair kidney damage. Surgery may involve:  °· Repair of a torn kidney.   °· Removal of an obstruction. °HOME CARE INSTRUCTIONS °· Follow your prescribed diet. °· Take medicines only as directed by your health care provider.  °· Do not take any new medicines (prescription, over-the-counter, or nutritional supplements) unless approved by your health care provider. Many medicines can worsen your kidney damage or may need to have the dose adjusted.   °· Keep all follow-up visits as directed by your health care provider. This is important. °· Observe your condition to make sure you are healing as expected. °SEEK IMMEDIATE MEDICAL CARE IF: °· You are feeling ill or have severe pain in the back or side.   °· Your symptoms return or you have new symptoms. °· You have any symptoms of end-stage kidney disease. These include:   °¨ Persistent itchiness.   °¨   Loss of appetite.   °¨ Headaches.   °¨ Abnormally dark or light skin. °¨ Numbness in the hands or feet.   °¨ Easy bruising.   °¨ Frequent hiccups.   °¨ Menstruation stops.   °· You have a fever. °· You have increased urine production. °· You have pain or bleeding when urinating. °MAKE SURE YOU:  °· Understand these instructions. °· Will watch your condition. °· Will get help right away if you are not doing well or get worse. °  °This  information is not intended to replace advice given to you by your health care provider. Make sure you discuss any questions you have with your health care provider. °  °Document Released: 10/22/2010 Document Revised: 04/29/2014 Document Reviewed: 12/06/2011 °Elsevier Interactive Patient Education ©2016 [employer] ° °

## 2015-11-19 NOTE — Progress Notes (Signed)
NURSING PROGRESS NOTE  Jerry Mitchell [id number] Discharge Data: 11/19/2015 9:19 AM Attending Provider: Renae Fickle, MD PCP:No PCP Per Patient   Gabriel Rung to be D/C'd Home per MD order.    All IV's will be discontinued and monitored for bleeding.  All belongings will be returned to patient for patient to take home.  Last Documented Vital Signs:  Blood pressure 120/62, pulse (!) 54, temperature 97.8 F (36.6 C), resp. rate 18, height 5\' 11"  (1.803 m), weight 101.2 kg (223 lb), SpO2 97 %.  Madelin Rear, MSN, RN, [employer]

## 2015-11-19 NOTE — Discharge Summary (Signed)
Physician Discharge Summary  Jerry Mitchell JYN:[id number] DOB: [birth year] DOA: 11/17/2015  PCP: No PCP Per Patient  Admit date: 11/17/2015 Discharge date: 11/19/2015  Admitted From: home  Disposition:  home  Recommendations for Outpatient Follow-up:  1. Follow up with PCP in 2 weeks 2. Please obtain BMP/CBC and UA at next visit 3. Pending labs: ANA and RF  Home Health:  none  Equipment/Devices:  none  Discharge Condition:  Stable, improved CODE STATUS:  full  Diet recommendation:  regular   Brief/Interim Summary: Jerry Brownis a 40 y.o.malewith medical history significant of hepatitis C who works as a Designer, fashion/clothing and comes to the emergency department due to feeling dehydrated, muscle cramping on extremities, abdomen and back after being working outside the past few days.  For the last several weeks, he has been taking ibuprofen 200mg  tabs, 6 tabs 2-3 times per day.  Creatinine level was 1.07 mg/dL in March and was 8.65 mg/dL on admission. Urine shows some proteinuria, but no hemoglobin or RBCs. Total CKs was 425 U/L.  Started on IVF and his creatinine trended down to normal.  He had some mild anemia and thrombocytopenia at the time of discharge which are likely hemodilutional.  Recommended that he have these labs repeated in 2 weeks by his PCP to make sure they have corrected.  Discharge Diagnoses:  Principal Problem:   AKI (acute kidney injury) (HCC) Active Problems:   Rhabdomyolysis   Elevated liver enzymes   Hepatitis C   Hyponatremia  AKI (acute kidney injury) (HCC), creatinine trended down from 3.7 to 1 mg/dl with IVF.  RUS without evidence of obstruction.  ANA and RF were obtained prior to obtaining the history that the patient was also taking copious NSAIDS and are pending at the time of discharge.    Active Problems: Hyponatremia due to dehydration, resolved with IVF  MildRhabdomyolysis, CK trended down with IVF.  Elevated liver enzymes likely due to rhabdo and  chronic HCV.  Improved with IVF.   Hepatitis C Per patient, he will seek treatment for hepatitis C once he has health insurance.  Mild dilutional anemia.  Repeat CBC as outpatient  Mild hypokalemia, resolved with oral potassium repletion  Left shoulder pain, possible rotator cuff injury.  Recommended tylenol prn and salonpas until he can see his PCP.    Discharge Instructions  Discharge Instructions    Call MD for:  difficulty breathing, headache or visual disturbances    Complete by:  As directed   Call MD for:  extreme fatigue    Complete by:  As directed   Call MD for:  hives    Complete by:  As directed   Call MD for:  persistant dizziness or light-headedness    Complete by:  As directed   Call MD for:  persistant nausea and vomiting    Complete by:  As directed   Call MD for:  severe uncontrolled pain    Complete by:  As directed   Call MD for:  temperature >100.4    Complete by:  As directed   Diet general    Complete by:  As directed   Discharge instructions    Complete by:  As directed   You may use tylenol and Salonpas for pain.  Do not use ibuprofen, naprosyn, goody's powders or any other over the counter pain medications without consulting your pharmacist or doctor.   Increase activity slowly    Complete by:  As directed       Medication List  You have not been prescribed any medications.    Follow-up Information    [facility]. Schedule an appointment as soon as possible for a visit in 2 week(s).   Contact information: [address] [place] [address] [phone]         No Known Allergies  Consultations: none   Procedures/Studies: US Renal  Result Date: 11/18/2015 CLINICAL DATA:  Acute renal failure. EXAM: RENAL / URINARY TRACT ULTRASOUND COMPLETE COMPARISON:  None. FINDINGS: Right Kidney: Length: 11.2 cm. Echogenicity within normal limits. No mass or hydronephrosis visualized. Left Kidney:  Length: 12.6 cm. Two 3 cm cysts are seen in the left kidney. No hydronephrosis. Bladder: Appears normal for degree of bladder distention. A mass is seen in the spleen measuring 3.2 x 3.1 x 2.7 cm, heterogeneous in echogenic texture. A small accessory spleen is identified. The spleen is enlarged measuring 13.1 cm in greatest dimension with a volume of 777 cc. IMPRESSION: 1. 3.2 cm mass in the spleen, nonspecific, but likely benign. Recommend comparison to previous outside studies if available. 2. Splenomegaly. 3. Two left renal cysts. 4. No cause for acute renal failure/injury. Electronically Signed   By: Gerome Sam III M.D   On: 11/18/2015 10:45   Subjective: Feels back to normal.  Did not realize that NSAIDS were back for his kidneys.  Voiced understanding that tylenol would be preferable for pain.    Discharge Exam: Vitals:   11/18/15 2218 11/19/15 0459  BP: 134/76 120/62  Pulse: 64 (!) 54  Resp: 19 18  Temp: 99 F (37.2 C) 97.8 F (36.6 C)   Vitals:   11/18/15 0500 11/18/15 1649 11/18/15 2218 11/19/15 0459  BP:  (!) 100/57 134/76 120/62  Pulse:  (!) 49 64 (!) 54  Resp:  17 19 18   Temp:  97.8 F (36.6 C) 99 F (37.2 C) 97.8 F (36.6 C)  TempSrc:  Oral    SpO2:  98% 99% 97%  Weight: 96.4 kg (212 lb 8 oz)   101.2 kg (223 lb)  Height:        General: Pt is alert, awake, not in acute distress Cardiovascular: RRR, S1/S2 +, no rubs, no gallops Respiratory: CTA bilaterally, no wheezing, no rhonchi Abdominal: Soft, NT, ND, bowel sounds + Extremities: no edema, no cyanosis.  Mild pain with ROM of the left shoulder.  No bony deformity    The results of significant diagnostics from this hospitalization (including imaging, microbiology, ancillary and laboratory) are listed below for reference.     Microbiology: No results found for this or any previous visit (from the past 240 hour(s)).   Labs: BNP (last 3 results) No results for input(s): BNP in the last 8760 hours. Basic  Metabolic Panel:  Recent Labs Lab 11/17/15 1850 11/18/15 0417 11/19/15 0600  NA 131* 137 138  K 4.6 3.4* 4.8  CL 93* 104 111  CO2 24 25 24   GLUCOSE 101* 106* 89  BUN 35* 32* 16  CREATININE 3.71* 2.58* 1.07  CALCIUM 9.8 8.4* 8.4*  MG  --  2.0  --   PHOS  --  4.9*  --    Liver Function Tests:  Recent Labs Lab 11/17/15 1850 11/18/15 0417 11/19/15 0600  AST 101* 54* 65*  ALT 140* 93* 96*  ALKPHOS 77 64 47  BILITOT 1.1 0.6 1.0  PROT 8.2* 6.9 5.8*  ALBUMIN 4.9 3.4* 2.9*    Recent Labs Lab 11/17/15 1850  LIPASE 29  No results for input(s): AMMONIA in the last 168 hours. CBC:  Recent Labs Lab 11/17/15 1850 11/18/15 0417 11/19/15 0600  WBC 9.8 7.3 4.6  NEUTROABS  --  3.3 1.9  HGB 13.8 11.7* 11.3*  HCT 39.1 34.3* 33.5*  MCV 87.9 88.4 90.3  PLT 205 162 120*   Cardiac Enzymes:  Recent Labs Lab 11/17/15 1850 11/18/15 0417  CKTOTAL 425* 334   BNP: Invalid input(s): POCBNP CBG: No results for input(s): GLUCAP in the last 168 hours. D-Dimer No results for input(s): DDIMER in the last 72 hours. Hgb A1c No results for input(s): HGBA1C in the last 72 hours. Lipid Profile No results for input(s): CHOL, HDL, LDLCALC, TRIG, CHOLHDL, LDLDIRECT in the last 72 hours. Thyroid function studies No results for input(s): TSH, T4TOTAL, T3FREE, THYROIDAB in the last 72 hours.  Invalid input(s): FREET3 Anemia work up No results for input(s): VITAMINB12, FOLATE, FERRITIN, TIBC, IRON, RETICCTPCT in the last 72 hours. Urinalysis    Component Value Date/Time   COLORURINE AMBER (A) 11/17/2015 1852   APPEARANCEUR HAZY (A) 11/17/2015 1852   LABSPEC 1.026 11/17/2015 1852   PHURINE 5.0 11/17/2015 1852   GLUCOSEU NEGATIVE 11/17/2015 1852   HGBUR NEGATIVE 11/17/2015 1852   BILIRUBINUR MODERATE (A) 11/17/2015 1852   KETONESUR 15 (A) 11/17/2015 1852   PROTEINUR 30 (A) 11/17/2015 1852   UROBILINOGEN 1.0 12/21/2014 1630   NITRITE NEGATIVE 11/17/2015 1852   LEUKOCYTESUR  SMALL (A) 11/17/2015 1852   Sepsis Labs Invalid input(s): PROCALCITONIN,  WBC,  LACTICIDVEN   Time coordinating discharge: Over 30 minutes  SIGNED:   Renae Fickle, MD  [facility] 11/19/2015, 12:40 PM Pager   If 7PM-7AM, please contact night-coverage [email or web address] Password [email or web address]

## 2015-11-20 LAB — ANTINUCLEAR ANTIBODIES, IFA: ANTINUCLEAR ANTIBODIES, IFA: NEGATIVE

## 2015-12-05 ENCOUNTER — Emergency Department (HOSPITAL_COMMUNITY)
Admission: EM | Admit: 2015-12-05 | Discharge: 2015-12-05 | Disposition: A | Discharge status: Home / Self Care | Payer: Self-pay | Attending: Emergency Medicine | Admitting: Emergency Medicine

## 2015-12-05 ENCOUNTER — Encounter (HOSPITAL_COMMUNITY): Payer: Self-pay | Admitting: Emergency Medicine

## 2015-12-05 DIAGNOSIS — E860 Dehydration: Secondary | ICD-10-CM | POA: Insufficient documentation

## 2015-12-05 DIAGNOSIS — R55 Syncope and collapse: Secondary | ICD-10-CM | POA: Insufficient documentation

## 2015-12-05 DIAGNOSIS — F17200 Nicotine dependence, unspecified, uncomplicated: Secondary | ICD-10-CM | POA: Insufficient documentation

## 2015-12-05 LAB — CBC
HEMATOCRIT: 41.4 % (ref 39.0–52.0)
HEMOGLOBIN: 14.3 g/dL (ref 13.0–17.0)
MCH: 30.9 pg (ref 26.0–34.0)
MCHC: 34.5 g/dL (ref 30.0–36.0)
MCV: 89.4 fL (ref 78.0–100.0)
Platelets: 215 10*3/uL (ref 150–400)
RBC: 4.63 MIL/uL (ref 4.22–5.81)
RDW: 13 % (ref 11.5–15.5)
WBC: 15.1 10*3/uL — ABNORMAL HIGH (ref 4.0–10.5)

## 2015-12-05 LAB — URINALYSIS, ROUTINE W REFLEX MICROSCOPIC
GLUCOSE, UA: NEGATIVE mg/dL
HGB URINE DIPSTICK: NEGATIVE
Ketones, ur: 15 mg/dL — AB
Nitrite: NEGATIVE
PROTEIN: 100 mg/dL — AB
SPECIFIC GRAVITY, URINE: 1.029 (ref 1.005–1.030)
pH: 5 (ref 5.0–8.0)

## 2015-12-05 LAB — LIPASE, BLOOD: LIPASE: 20 U/L (ref 11–51)

## 2015-12-05 LAB — COMPREHENSIVE METABOLIC PANEL
ALBUMIN: 5.1 g/dL — AB (ref 3.5–5.0)
ALT: 231 U/L — ABNORMAL HIGH (ref 17–63)
ANION GAP: 10 (ref 5–15)
AST: 161 U/L — AB (ref 15–41)
Alkaline Phosphatase: 63 U/L (ref 38–126)
BILIRUBIN TOTAL: 1.2 mg/dL (ref 0.3–1.2)
BUN: 18 mg/dL (ref 6–20)
CHLORIDE: 96 mmol/L — AB (ref 101–111)
CO2: 29 mmol/L (ref 22–32)
Calcium: 10.6 mg/dL — ABNORMAL HIGH (ref 8.9–10.3)
Creatinine, Ser: 1.82 mg/dL — ABNORMAL HIGH (ref 0.61–1.24)
GFR calc Af Amer: 52 mL/min — ABNORMAL LOW (ref >60)
GFR calc non Af Amer: 45 mL/min — ABNORMAL LOW (ref >60)
GLUCOSE: 81 mg/dL (ref 65–99)
POTASSIUM: 4.6 mmol/L (ref 3.5–5.1)
SODIUM: 135 mmol/L (ref 135–145)
TOTAL PROTEIN: 9.3 g/dL — AB (ref 6.5–8.1)

## 2015-12-05 LAB — URINE MICROSCOPIC-ADD ON

## 2015-12-05 LAB — CK: CK TOTAL: 246 U/L (ref 49–397)

## 2015-12-05 MED ORDER — SODIUM CHLORIDE 0.9 % IV BOLUS (SEPSIS)
1000.0000 mL | Freq: Once | INTRAVENOUS | Status: AC
  Administered 2015-12-05: 1000 mL via INTRAVENOUS

## 2015-12-05 MED ORDER — ACETAMINOPHEN 500 MG PO TABS
1000.0000 mg | ORAL_TABLET | Freq: Once | ORAL | Status: AC
  Administered 2015-12-05: 1000 mg via ORAL
  Filled 2015-12-05: qty 2

## 2015-12-05 NOTE — ED Triage Notes (Signed)
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

## 2015-12-05 NOTE — ED Notes (Addendum)
Pt ambulated after second liter of fluid. Pt states he does not feel lightheaded, however his cramps have not improved. MD notified

## 2015-12-05 NOTE — Progress Notes (Signed)
Consulted by patients provider in regards to follow up care. Spoke to patient regarding this issue. Follow up appointment made with the [facility] for Friday Sept 29,2017 @ 9:30am, pt verbalized understanding of the upcoming appointment. [facility] also provided and explained, pt instructed to contact me once application is complete for an appointment to obtain the orange card. My contact information provided for any future questions or concerns. No other [facility] Specialist needs identified at this time.    Buddy DutyFelicia Evans [id number] [phone]

## 2015-12-05 NOTE — ED Provider Notes (Signed)
I saw and evaluated the patient, reviewed the resident's note and I agree with the findings and plan.   EKG Interpretation None      40 year old male who presents with dizziness and c/f dehydration. History of HCV. 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 Roofing all day today in the heat and trying to keep up with oral fluids. States around noon began to develop dizziness/lightheadedness and cramping all over. Similar to prior episodes of dehydration. Came to ED for evaluation. Denies confusion, headache, n/v, abdominal pain, diarrhea, f/c, difficulty breathing or chest pain. No syncope. Appears dry on exam. Benign abdomen and unremarkable cardiopulmonary exam. Vital signs non-concerning. With AKI Cr 1.8 from baseline of 1.07. No major electrolyte derangements. CK normal. UA with . Rehydrating IV and orally.  Does not require inpatient admission. He is set up with follow-up with [facility] health and wellness by case management for recheck on creatinine in two days. Encouraged to rest, drink plenty of fluids, and given work note for next few days.     Lavera Guiseana Duo Angelito Hopping, MD 12/06/15 1126

## 2015-12-05 NOTE — ED Notes (Signed)
Ambulated pt after fluid. Pt still feeling slightly dizzy. Pt states he is still having muscle cramps. Notified MD. Will give second liter of fluid.

## 2015-12-05 NOTE — ED Provider Notes (Signed)
[facility]-EMERGENCY DEPT Provider Note   CSN: [id number] Arrival date & time: 12/05/15  1345     History   Chief Complaint Chief Complaint  Patient presents with  . Dizziness    HPI Jerry Mitchell is a 40 y.o. male.  The history is provided by the patient. No language interpreter was used.  Dizziness  Quality:  Lightheadedness Severity:  Moderate Onset quality:  Gradual Duration: a few hours. Timing:  Constant Progression:  Worsening Chronicity:  Recurrent Context: physical activity   Context: not when bending over, not with bowel movement, not with head movement, not with inactivity, not with loss of consciousness, not with medication, not when standing up and not when urinating   Relieved by:  Nothing Worsened by:  Movement Ineffective treatments:  None tried Associated symptoms: weakness   Associated symptoms: no chest pain, no diarrhea, no headaches, no nausea, no palpitations, no shortness of breath, no syncope and no vision changes   Associated symptoms comment:  Cramps Risk factors: no anemia, no heart disease, no hx of stroke, no hx of vertigo, no multiple medications and no new medications     Past Medical History:  Diagnosis Date  . Hepatitis C     Patient Active Problem List   Diagnosis Date Noted  . Thrombocytopenia (HCC) 11/19/2015  . Hepatitis C 11/17/2015  . Hyponatremia 11/17/2015  . AKI (acute kidney injury) (HCC) 12/22/2014  . Elevated liver enzymes 12/22/2014  . Elevated fasting glucose 12/22/2014  . Rhabdomyolysis 12/21/2014    History reviewed. No pertinent surgical history.     Home Medications    Prior to Admission medications   Not on File    Family History No family history on file.  Social History Social History  Substance Use Topics  . Smoking status: Light Tobacco Smoker  . Smokeless tobacco: Never Used  . Alcohol use Yes     Allergies   Review of patient's allergies indicates no known allergies.   Review of  Systems Review of Systems  Constitutional: Positive for diaphoresis (today while working). Negative for chills and fever.  HENT: Negative.   Eyes: Negative.   Respiratory: Negative for shortness of breath.   Cardiovascular: Negative for chest pain, palpitations and syncope.  Gastrointestinal: Negative for diarrhea and nausea.  Genitourinary: Negative for dysuria and frequency.  Musculoskeletal: Positive for myalgias.  Skin: Negative for pallor and rash.  Neurological: Positive for dizziness and weakness. Negative for seizures, syncope and headaches.  Psychiatric/Behavioral: Negative.  Negative for confusion.     Physical Exam Updated Vital Signs BP 152/77 (BP Location: Left Arm)   Pulse 69   Temp 99.1 F (37.3 C) (Oral)   Resp 16   SpO2 99%   Physical Exam  Constitutional: He is oriented to person, place, and time. He appears well-developed and well-nourished. No distress.  HENT:  Head: Normocephalic and atraumatic.  Somewhat dry MM  Eyes: Conjunctivae are normal. No scleral icterus.  Neck: Normal range of motion. Neck supple. No tracheal deviation present.  Cardiovascular: Normal rate, regular rhythm, normal heart sounds and intact distal pulses.   No murmur heard. Pulmonary/Chest: Effort normal and breath sounds normal. No stridor. No respiratory distress. He has no wheezes. He has no rales.  Abdominal: Soft. Bowel sounds are normal. He exhibits no distension. There is no tenderness. There is no rebound and no guarding.  Musculoskeletal: He exhibits no edema.  Neurological: He is alert and oriented to person, place, and time. GCS eye subscore is 4. GCS  verbal subscore is 5. GCS motor subscore is 6.  Skin: Skin is warm and dry. Capillary refill takes less than 2 seconds. He is not diaphoretic (he was on arrival though, had to change to paper scrubs).  No skin tenting  Psychiatric: He has a normal mood and affect. His behavior is normal. Judgment and thought content normal.   Nursing note and vitals reviewed.    ED Treatments / Results  Labs (all labs ordered are listed, but only abnormal results are displayed) Labs Reviewed  COMPREHENSIVE METABOLIC PANEL - Abnormal; Notable for the following:       Result Value   Chloride 96 (*)    Creatinine, Ser 1.82 (*)    Calcium 10.6 (*)    Total Protein 9.3 (*)    Albumin 5.1 (*)    AST 161 (*)    ALT 231 (*)    GFR calc non Af Amer 45 (*)    GFR calc Af Amer 52 (*)    All other components within normal limits  CBC - Abnormal; Notable for the following:    WBC 15.1 (*)    All other components within normal limits  LIPASE, BLOOD  URINALYSIS, ROUTINE W REFLEX MICROSCOPIC (NOT AT [facility])  CK    EKG  EKG Interpretation None       Radiology No results found.  Procedures Procedures (including critical care time)  Medications Ordered in ED Medications  sodium chloride 0.9 % bolus 1,000 mL (0 mLs Intravenous Stopped 12/05/15 1610)  sodium chloride 0.9 % bolus 1,000 mL (0 mLs Intravenous Stopped 12/05/15 1712)  acetaminophen (TYLENOL) tablet 1,000 mg (1,000 mg Oral Given 12/05/15 1610)     Initial Impression / Assessment and Plan / ED Course  I have reviewed the triage vital signs and the nursing notes.  Pertinent labs & imaging results that were available during my care of the patient were reviewed by me and considered in my medical decision making (see chart for details).  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 Patient is unsure how well he was hydrating today. States that he felt similar to prior to his last admission. Had an AKI last time and concern for rhabdo. Repeat labs today are not as elevated and UA is WNL. Patient has a history of hep C but no abdominal pain today even though LFTs are mildly increased. Has a  mild leukocytosis however likely secondary to dehydration. Denies any other infectious symptoms. Appears well in room. Denies any nausea, so will rehydrate with oral and IV fluids. Patient was ambulated and was not orthostatic. Gait intact.. Has not established care with a PCP yet since his discharge. Was given an appointment for Thursday to get bloodwork rechecked and follow up appointment on 9/29 as well. Discussed results and plan with patient. Stated understanding and is in agreement with plan. All questions answered. Usual and customary return precautions given for dehydration.  Final Clinical Impressions(s) / ED Diagnoses   Final diagnoses:  Dehydration  Near syncope    New Prescriptions New Prescriptions   No medications on file     Maretta BeesLouis Ayyan Sites, MD 12/07/15 2332    Lavera Guiseana Duo Liu, MD 12/08/15 [phone]

## 2015-12-05 NOTE — Care Management (Addendum)
ED CM consulted concerning follow up at [facility] this week. CM met with patient to discuss the recommendation for follow up Thursday 8/17 at 9:30 at the [facility]. Patient is agreeable to discharge plan, information provided for appt at the [facility]. Patient verbalized understanding teach back done. Updated Dr. Fornage. No further ED CM needs identified. 

## 2015-12-05 NOTE — ED Notes (Signed)
Gave pt water and turkey sandwich 

## 2015-12-06 ENCOUNTER — Telehealth: Payer: Self-pay | Admitting: *Deleted

## 2015-12-20 ENCOUNTER — Emergency Department (HOSPITAL_COMMUNITY)
Admission: EM | Admit: 2015-12-20 | Discharge: 2015-12-20 | Disposition: A | Discharge status: Home / Self Care | Payer: Self-pay | Attending: Emergency Medicine | Admitting: Emergency Medicine

## 2015-12-20 ENCOUNTER — Encounter (HOSPITAL_COMMUNITY): Payer: Self-pay | Admitting: Emergency Medicine

## 2015-12-20 DIAGNOSIS — M545 Low back pain, unspecified: Secondary | ICD-10-CM

## 2015-12-20 DIAGNOSIS — F17200 Nicotine dependence, unspecified, uncomplicated: Secondary | ICD-10-CM | POA: Insufficient documentation

## 2015-12-20 MED ORDER — IBUPROFEN 600 MG PO TABS: 600.0000 mg | ORAL_TABLET | Freq: Three times a day (TID) | ORAL | 0 refills | Status: DC | PRN

## 2015-12-20 MED ORDER — METHOCARBAMOL 500 MG PO TABS: 500.0000 mg | ORAL_TABLET | Freq: Once | ORAL | Status: DC

## 2015-12-20 MED ORDER — KETOROLAC TROMETHAMINE 60 MG/2ML IM SOLN: 60.0000 mg | Freq: Once | INTRAMUSCULAR | Status: DC

## 2015-12-20 MED ORDER — METHOCARBAMOL 500 MG PO TABS: 500.0000 mg | ORAL_TABLET | Freq: Three times a day (TID) | ORAL | 0 refills | Status: DC | PRN

## 2015-12-20 NOTE — ED Notes (Signed)
Attempts made with home and cell numbers to contact pt. Not successful.

## 2015-12-20 NOTE — ED Provider Notes (Signed)
[facility]-EMERGENCY DEPT Provider Note   CSN: [id number] Arrival date & time: 12/20/15  0818     History   Chief Complaint Chief Complaint  Patient presents with  . Back Pain    HPI Jerry Mitchell is a 40 y.o. male.  Patient reports right low back pain worsening over the past several weeks.  He states this occurred acutely after sneezing.  He denies radiation of his pain down his right leg.  Denies abdominal pain.  No fevers or chills.  Denies nausea vomiting.  Reports discomfort with movement in his right low back.  Has tried heat packs without improvement in his symptoms.      Past Medical History:  Diagnosis Date  . Hepatitis C     Patient Active Problem List   Diagnosis Date Noted  . Thrombocytopenia (HCC) 11/19/2015  . Hepatitis C 11/17/2015  . Hyponatremia 11/17/2015  . AKI (acute kidney injury) (HCC) 12/22/2014  . Elevated liver enzymes 12/22/2014  . Elevated fasting glucose 12/22/2014  . Rhabdomyolysis 12/21/2014    History reviewed. No pertinent surgical history.     Home Medications    Prior to Admission medications   Medication Sig Start Date End Date Taking? Authorizing Provider  ibuprofen (ADVIL,MOTRIN) 600 MG tablet Take 1 tablet (600 mg total) by mouth every 8 (eight) hours as needed. 12/20/15   Azalia Bilis, MD  methocarbamol (ROBAXIN) 500 MG tablet Take 1 tablet (500 mg total) by mouth every 8 (eight) hours as needed for muscle spasms. 12/20/15   Azalia Bilis, MD    Family History No family history on file.  Social History Social History  Substance Use Topics  . Smoking status: Light Tobacco Smoker  . Smokeless tobacco: Never Used  . Alcohol use Yes     Allergies   Review of patient's allergies indicates no known allergies.   Review of Systems Review of Systems  All other systems reviewed and are negative.    Physical Exam Updated Vital Signs BP 160/95 (BP Location: Left Arm)   Pulse 66   Temp 98 F (36.7 C) (Oral)   Resp 19    Ht 6' (1.829 m)   Wt 220 lb (99.8 kg)   SpO2 100%   BMI 29.84 kg/m   Physical Exam  Constitutional: He is oriented to person, place, and time. He appears well-developed and well-nourished.  HENT:  Head: Normocephalic.  Eyes: EOM are normal.  Neck: Normal range of motion.  Pulmonary/Chest: Effort normal.  Abdominal: He exhibits no distension.  Musculoskeletal:  Full range of motion bilateral hips and knees.  Normal strength in bilateral lower extremity major muscle groups.  No thoracic or lumbar point tenderness.  Mild paralumbar tenderness on the right.  No overlying skin changes.  Neurological: He is alert and oriented to person, place, and time.  Psychiatric: He has a normal mood and affect.  Nursing note and vitals reviewed.    ED Treatments / Results  Labs (all labs ordered are listed, but only abnormal results are displayed) Labs Reviewed - No data to display  EKG  EKG Interpretation None       Radiology No results found.  Procedures Procedures (including critical care time)  Medications Ordered in ED Medications  ketorolac (TORADOL) injection 60 mg (not administered)  methocarbamol (ROBAXIN) tablet 500 mg (not administered)     Initial Impression / Assessment and Plan / ED Course  I have reviewed the triage vital signs and the nursing notes.  Pertinent labs & imaging results  that were available during my care of the patient were reviewed by me and considered in my medical decision making (see chart for details).  Clinical Course    Suspect musculoskeletal back pain.  No indication for acute imaging.  Home with anti-inflammatories and muscle relaxants.  Pain treated in the ER.  No abdominal tenderness.  Patient will need a primary care physician.  Final Clinical Impressions(s) / ED Diagnoses   Final diagnoses:  Right-sided low back pain without sciatica    New Prescriptions New Prescriptions   IBUPROFEN (ADVIL,MOTRIN) 600 MG TABLET    Take 1  tablet (600 mg total) by mouth every 8 (eight) hours as needed.   METHOCARBAMOL (ROBAXIN) 500 MG TABLET    Take 1 tablet (500 mg total) by mouth every 8 (eight) hours as needed for muscle spasms.     Azalia BilisKevin Eleonore Shippee, MD 12/20/15 [phone]

## 2015-12-20 NOTE — ED Notes (Signed)
Jerry Mitchell Triage RN states she witnessed pt walking out of ED. Pt without complaints.

## 2015-12-20 NOTE — ED Triage Notes (Signed)
Patient states that about month ago sneezed really hard and ever since been having right side lower back pain. Patient states that pain isnt relieved by anything.

## 2015-12-20 NOTE — ED Notes (Signed)
PT NOT IN ROOM

## 2015-12-20 NOTE — ED Notes (Addendum)
PT NOT IN ROOM. [facility] RN MADE AWARE OF PT CURRENT STATUS

## 2015-12-20 NOTE — ED Notes (Addendum)
PT NOT IN ROOM. ATTEMPTS MADE TO LOCATE THIS PT IN ED.

## 2015-12-24 ENCOUNTER — Emergency Department (HOSPITAL_COMMUNITY)
Admission: EM | Admit: 2015-12-24 | Discharge: 2015-12-24 | Disposition: A | Discharge status: Home / Self Care | Payer: Self-pay | Attending: Emergency Medicine | Admitting: Emergency Medicine

## 2015-12-24 ENCOUNTER — Emergency Department (HOSPITAL_COMMUNITY): Payer: Self-pay

## 2015-12-24 ENCOUNTER — Encounter (HOSPITAL_COMMUNITY): Payer: Self-pay

## 2015-12-24 DIAGNOSIS — F17200 Nicotine dependence, unspecified, uncomplicated: Secondary | ICD-10-CM | POA: Insufficient documentation

## 2015-12-24 DIAGNOSIS — M545 Low back pain, unspecified: Secondary | ICD-10-CM

## 2015-12-24 LAB — URINALYSIS, ROUTINE W REFLEX MICROSCOPIC
Bilirubin Urine: NEGATIVE
Glucose, UA: NEGATIVE mg/dL
Hgb urine dipstick: NEGATIVE
Ketones, ur: NEGATIVE mg/dL
LEUKOCYTES UA: NEGATIVE
NITRITE: NEGATIVE
Protein, ur: NEGATIVE mg/dL
SPECIFIC GRAVITY, URINE: 1.021 (ref 1.005–1.030)
pH: 5.5 (ref 5.0–8.0)

## 2015-12-24 MED ORDER — HYDROCODONE-ACETAMINOPHEN 5-325 MG PO TABS
1.0000 | ORAL_TABLET | Freq: Once | ORAL | Status: AC
  Administered 2015-12-24: 1 via ORAL
  Filled 2015-12-24: qty 1

## 2015-12-24 NOTE — ED Notes (Signed)
Pt requesting information on an [employer], SW to speak with the pt & print off a resource

## 2015-12-24 NOTE — ED Triage Notes (Addendum)
Patient complains of lower back pain and right groin pain. Pain to back more chronic with pain x 1 week,  right groin pain started last pm after he turned over in the bed. denies urinary symptoms. NAD

## 2015-12-24 NOTE — ED Provider Notes (Signed)
[facility]-EMERGENCY DEPT Provider Note   CSN: [id number] Arrival date & time: 12/24/15  [id number]     History   Chief Complaint Chief Complaint  Patient presents with  . Back Pain    HPI Jerry Mitchell is a 40 y.o. male.  40 year old male with past medical history below who presents with low back pain. The patient presented here on 8/30 for low back pain and he states that his back pain has worsened since then. The pain is on the right lower side of his back and worse when he tries to stand up or twists his back. No associated extremity numbness or weakness. He has had some nausea and diarrhea recently but no vomiting, fevers, night sweats, or unintentional weight loss. No history of trauma or injury to his back. He also notes intermittent right groin pain that occurred last night when he turned over in bed; he has had this groin pain intermittently for the past several years. He denies any testicular pain or swelling and is not aware of any hernia. No urinary symptoms.   The history is provided by the patient.    Past Medical History:  Diagnosis Date  . Hepatitis C     Patient Active Problem List   Diagnosis Date Noted  . Thrombocytopenia (HCC) 11/19/2015  . Hepatitis C 11/17/2015  . Hyponatremia 11/17/2015  . AKI (acute kidney injury) (HCC) 12/22/2014  . Elevated liver enzymes 12/22/2014  . Elevated fasting glucose 12/22/2014  . Rhabdomyolysis 12/21/2014    History reviewed. No pertinent surgical history.     Home Medications    Prior to Admission medications   Medication Sig Start Date End Date Taking? Authorizing Provider  ibuprofen (ADVIL,MOTRIN) 600 MG tablet Take 1 tablet (600 mg total) by mouth every 8 (eight) hours as needed. 12/20/15   [name], MD  methocarbamol (ROBAXIN) 500 MG tablet Take 1 tablet (500 mg total) by mouth every 8 (eight) hours as needed for muscle spasms. 12/20/15   [name], MD    Family History No family history on file.  Social  History Social History  Substance Use Topics  . Smoking status: Light Tobacco Smoker  . Smokeless tobacco: Never Used  . Alcohol use Yes     Allergies   Review of patient's allergies indicates no known allergies.   Review of Systems Review of Systems 10 Systems reviewed and are negative for acute change except as noted in the HPI.   Physical Exam Updated Vital Signs BP 156/96   Pulse (!) 50   Temp 98.1 F (36.7 C) (Oral)   Resp 18   Ht 6' (1.829 m)   Wt 210 lb (95.3 kg)   SpO2 100%   BMI 28.48 kg/m   Physical Exam  Constitutional: He is oriented to person, place, and time. He appears well-developed and well-nourished. No distress.  HENT:  Head: Normocephalic and atraumatic.  Moist mucous membranes  Eyes: Conjunctivae are normal. Pupils are equal, round, and reactive to light.  Neck: Neck supple.  Cardiovascular: Normal rate, regular rhythm and normal heart sounds.   No murmur heard. Pulmonary/Chest: Effort normal and breath sounds normal.  Abdominal: Soft. Bowel sounds are normal. He exhibits no distension. There is no tenderness.  Genitourinary:  Genitourinary Comments: Erect penis; b/l descended testes with no swelling, redness, or tenderness; no evidence of inguinal hernia  Musculoskeletal: Normal range of motion. He exhibits no edema.  Neurological: He is alert and oriented to person, place, and time. He has normal reflexes.  He exhibits normal muscle tone.  Fluent speech 5/5 strength and normal sensation BLE No clonus  Skin: Skin is warm and dry. No rash noted.  Psychiatric: He has a normal mood and affect. Judgment normal.  Nursing note and vitals reviewed.  Chaperone was present during exam.   ED Treatments / Results  Labs (all labs ordered are listed, but only abnormal results are displayed) Labs Reviewed  URINALYSIS, ROUTINE W REFLEX MICROSCOPIC (NOT AT [facility])    EKG  EKG Interpretation None       Radiology No results  found.  Procedures Procedures (including critical care time)  Medications Ordered in ED Medications  HYDROcodone-acetaminophen (NORCO/VICODIN) 5-325 MG per tablet 1 tablet (not administered)     Initial Impression / Assessment and Plan / ED Course  I have reviewed the triage vital signs and the nursing notes.  Pertinent labs & imaging results that were available during my care of the patient were reviewed by me and considered in my medical decision making (see chart for details).  Clinical Course   Patient with several weeks of right-sided low back pain, no urinary sx or vomiting. No signs/sx of cauda equina. Normal neuro exam. Because this is his second visit, obtained plain films of lumbar spine which showed degenerative Changes without any acute findings. Regarding the patient's report of right groin pain, he has no abnormalities on GU exam to suggest testicular problem and he has no abdominal tenderness on exam. Because this groin pain has been intermittent for at least a year, I do not feel he needs any abdominal labs or imaging at this time but I have reviewed return precautions including fever, vomiting, or urinary problems.  Patient demonstrates no lower extremity weakness, saddle anesthesia, bowel or bladder incontinence, or any other neurologic deficits concerning for cauda equina. No fevers or other infectious symptoms to suggest by the patient's back pain is due to an infection. I have reviewed return precautions, including the development of any of these signs or symptoms, and the patient has voiced understanding. I reviewed supportive care instructions, including scheduled tylenol, early range of motion exercises, and PCP follow-up if symptoms do not improve for referral to physical therapy. Prior to discharge, case management stopped by and provided patient with paperwork to obtain Medicaid. Patient voiced understanding and was discharged in satisfactory condition.   Final  Clinical Impressions(s) / ED Diagnoses   Final diagnoses:  Low back pain    New Prescriptions New Prescriptions   No medications on file     Laurence Spates, MD 12/24/15 1027

## 2015-12-24 NOTE — Progress Notes (Signed)
CSW provided pt with information on [facility]. Pt was provided with a [employer] Card application as well as a business card to [facility].   Jerry Mitchell, LCSWA [facility] Clinical Social Worker [phone]

## 2016-01-19 ENCOUNTER — Ambulatory Visit: Payer: Self-pay | Admitting: Family Medicine

## 2016-03-18 ENCOUNTER — Emergency Department (HOSPITAL_COMMUNITY): Payer: Self-pay

## 2016-03-18 ENCOUNTER — Encounter (HOSPITAL_COMMUNITY): Payer: Self-pay | Admitting: *Deleted

## 2016-03-18 ENCOUNTER — Emergency Department (HOSPITAL_COMMUNITY)
Admission: EM | Admit: 2016-03-18 | Discharge: 2016-03-18 | Disposition: A | Discharge status: Home / Self Care | Payer: Self-pay | Attending: Emergency Medicine | Admitting: Emergency Medicine

## 2016-03-18 DIAGNOSIS — M79671 Pain in right foot: Secondary | ICD-10-CM | POA: Insufficient documentation

## 2016-03-18 DIAGNOSIS — F17200 Nicotine dependence, unspecified, uncomplicated: Secondary | ICD-10-CM | POA: Insufficient documentation

## 2016-03-18 MED ORDER — TRAMADOL HCL 50 MG PO TABS: 50.0000 mg | ORAL_TABLET | Freq: Two times a day (BID) | ORAL | 0 refills | Status: DC | PRN

## 2016-03-18 MED ORDER — TRAMADOL HCL 50 MG PO TABS
50.0000 mg | ORAL_TABLET | Freq: Once | ORAL | Status: AC
  Administered 2016-03-18: 50 mg via ORAL
  Filled 2016-03-18: qty 1

## 2016-03-18 NOTE — ED Provider Notes (Signed)
[facility]-EMERGENCY DEPT Provider Note   CSN: [id number] Arrival date & time: 03/18/16  1515  By signing my name below, I, Jerry Mitchell, attest that this documentation has been prepared under the direction and in the presence of Arvilla MeresAshley Meyer, PA-C.  Electronically Signed: Rosario AdieWilliam Andrew Mitchell, ED Scribe. 03/18/16. 4:32 PM.  History   Chief Complaint Chief Complaint  Patient presents with  . Foot Pain   The history is provided by the patient. No language interpreter was used.    HPI Comments: Jerry Mitchell is a 40 y.o. male who presents to the Emergency Department complaining of gradually worsening, constant pain to the dorsum of the right foot onset approximately 3 days ago. He describes his pain as sharp, and notes that it is exacerbated with ambulation and weight bearing. No treatments for his pain were tried prior to coming into the ED. No recent color change, swelling, or warmth to the area. He denies any recent trauma, injury, or falls to precipitate this issue. 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 No recent changes in activity.  No recent increased alcohol consumption or organ meat intake. No recent changes in medication. No h/o gout. He denies fever, numbness, weakness, or any other associated symptoms.   Past Medical History:  Diagnosis Date  . Hepatitis C    Patient Active Problem List   Diagnosis Date Noted  . Thrombocytopenia (HCC) 11/19/2015  . Hepatitis C 11/17/2015  . Hyponatremia 11/17/2015  . AKI (acute kidney injury) (HCC) 12/22/2014  . Elevated liver enzymes 12/22/2014  . Elevated fasting glucose 12/22/2014  . Rhabdomyolysis 12/21/2014   History reviewed. No pertinent surgical history.  Home Medications    Prior to Admission medications   Medication Sig Start Date End Date Taking? Authorizing Provider  ibuprofen (ADVIL,MOTRIN) 600 MG tablet Take 1 tablet (600 mg total) by mouth every 8 (eight) hours as needed. Patient not  taking: Reported on 12/24/2015 12/20/15   [name], MD  [facility]) Apply 1 patch topically as needed (for pain).    Historical Provider, MD  methocarbamol (ROBAXIN) 500 MG tablet Take 1 tablet (500 mg total) by mouth every 8 (eight) hours as needed for muscle spasms. Patient not taking: Reported on 12/24/2015 12/20/15   [name], MD  traMADol (ULTRAM) 50 MG tablet Take 1 tablet (50 mg total) by mouth every 12 (twelve) hours as needed. 03/18/16   [name], PA-C   Family History No family history on file.  Social History Social History  Substance Use Topics  . Smoking status: Light Tobacco Smoker  . Smokeless tobacco: Current User  . Alcohol use Yes   Allergies   Patient has no known allergies.  Review of Systems Review of Systems  Constitutional: Negative for fever.  Musculoskeletal: Positive for arthralgias and myalgias. Negative for gait problem and joint swelling.  Skin: Negative for color change.  Neurological: Negative for weakness and numbness.   Physical Exam Updated Vital Signs BP 138/97 (BP Location: Left Arm)   Pulse 77   Temp 98.1 F (36.7 C) (Oral)   Resp 16   Ht 6' (1.829 m)   Wt 93 kg   SpO2 98%   BMI 27.80 kg/m   Physical Exam  Constitutional: He appears well-developed and well-nourished. No distress.  HENT:  Head: Normocephalic and atraumatic.  Eyes: Conjunctivae are normal. No scleral icterus.  Neck: Normal range of motion.  Cardiovascular: Intact distal pulses.   Pulmonary/Chest: Effort normal.  No respiratory distress.  Musculoskeletal: Normal range of motion. He exhibits tenderness. He exhibits no edema or deformity.       Right foot: There is tenderness.  Right foot:  No warmth, redness, or swelling to the area. Third through fifth metatarsals are TTP on the right foot. Full ROM of the right foot and ankle. Sensation and strength intact throughout the area. Distal pulses are palpable.  Neurological: He is alert.  Skin:  Skin is warm and dry. He is not diaphoretic.  Psychiatric: He has a normal mood and affect. His behavior is normal.   ED Treatments / Results  DIAGNOSTIC STUDIES: Oxygen Saturation is 98% on RA, normal by my interpretation.   COORDINATION OF CARE: 4:32 PM-Discussed next steps with pt. Pt verbalized understanding and is agreeable with the plan.   Radiology Dg Foot Complete Right  Result Date: 03/18/2016 CLINICAL DATA:  Right foot pain EXAM: RIGHT FOOT COMPLETE - 3+ VIEW COMPARISON:  None. FINDINGS: Three views of the right foot submitted. No acute fracture or subluxation. Mild dorsal spurring talonavicular joint. IMPRESSION: Negative. Electronically Signed   By: Natasha MeadLiviu  Pop M.D.   On: 03/18/2016 15:58   Procedures Procedures   Medications Ordered in ED Medications  traMADol (ULTRAM) tablet 50 mg (50 mg Oral Given 03/18/16 1646)    Initial Impression / Assessment and Plan / ED Course  I have reviewed the triage vital signs and the nursing notes.  Pertinent labs & imaging results that were available during my care of the patient were reviewed by me and considered in my medical decision making (see chart for details).  Clinical Course as of Mar 19 1703  Mon Mar 18, 2016  1630 No obvious fracture or dislocation.  DG Foot Complete Right [AM]    Clinical Course User Index [AM] Lona Kettleshley Laurel Meyer, PA-C   This is a 40yo male who presents into the ED with a three day h/o right foot pain. Exam reveals moderate TTP along the third - fifth metatarsals of the right foot. No warmth, erythema, or swelling; afebrile - low suspicion for septic joint, cellulitis, or gout at this time. Will obtain XR imaging.    4:46 PM Patient XR negative for obvious fracture, dislocation. Of note, spurring at the dorsal talonavicular joint, which could be contributing to his discomfort. Pt advised to follow up with orthopedics if symptoms persist for possibility of missed fracture diagnosis. Patient given ace  wrap while in ED, conservative therapy recommended and discussed to include RICE. Patient will be d/c home. Rx tramadol as patient states he cannot take tylenol or motrin. Pt is comfortable with above plan and is stable for discharge at this time. All questions were answered prior to disposition. Strict return precautions for f/u into the ED were discussed.   Final Clinical Impressions(s) / ED Diagnoses   Final diagnoses:  Foot pain, right   New Prescriptions Discharge Medication List as of 03/18/2016  4:47 PM    START taking these medications   Details  traMADol (ULTRAM) 50 MG tablet Take 1 tablet (50 mg total) by mouth every 12 (twelve) hours as needed., Starting Mon 03/18/2016, Print       I personally performed the services described in this documentation, which was scribed in my presence. The recorded information has been reviewed and is accurate.     Lona Kettleshley Laurel Meyer, PA-C 03/18/16 1704    Raeford RazorStephen Kohut, MD 03/20/16 [id number]

## 2016-03-18 NOTE — ED Triage Notes (Signed)
Pt states pain to dorsum of R foot x 3 days.  Denies injury.  No deformity/swelling noted.

## 2016-03-18 NOTE — Discharge Instructions (Signed)
Read the information below.  Your x-ray showed no obvious fracture or dislocation. You do have some spurring of your distal foot, this could contribute to your symptoms. Ice and elevate for 20 minute increments for the next 2-3 days. You were provided an ace wrap for added support. I have prescribed tramadol for pain relief.  If symptoms persist for more than 5-7 days follow up with a primary provider, I have provided the contact information for [facility] above.  Use the prescribed medication as directed.  Please discuss all new medications with your pharmacist.   You may return to the Emergency Department at any time for worsening condition or any new symptoms that concern you. Return to ED if you develop redness, swelling, warmth, fever, or any other new/concerning symptoms.

## 2016-03-18 NOTE — ED Notes (Signed)
Patient transported to X-ray 

## 2016-03-18 NOTE — ED Notes (Signed)
Declined W/C at D/C and was escorted to lobby by RN. 

## 2016-11-20 ENCOUNTER — Emergency Department (HOSPITAL_COMMUNITY)
Admission: EM | Admit: 2016-11-20 | Discharge: 2016-11-20 | Disposition: A | Discharge status: Home / Self Care | Payer: Self-pay | Attending: Emergency Medicine | Admitting: Emergency Medicine

## 2016-11-20 ENCOUNTER — Encounter (HOSPITAL_COMMUNITY): Payer: Self-pay | Admitting: Emergency Medicine

## 2016-11-20 DIAGNOSIS — Z79899 Other long term (current) drug therapy: Secondary | ICD-10-CM | POA: Insufficient documentation

## 2016-11-20 DIAGNOSIS — M545 Low back pain, unspecified: Secondary | ICD-10-CM

## 2016-11-20 DIAGNOSIS — F17200 Nicotine dependence, unspecified, uncomplicated: Secondary | ICD-10-CM | POA: Insufficient documentation

## 2016-11-20 LAB — URINALYSIS, ROUTINE W REFLEX MICROSCOPIC
BILIRUBIN URINE: NEGATIVE
GLUCOSE, UA: NEGATIVE mg/dL
HGB URINE DIPSTICK: NEGATIVE
Ketones, ur: NEGATIVE mg/dL
Leukocytes, UA: NEGATIVE
NITRITE: NEGATIVE
PH: 6 (ref 5.0–8.0)
Protein, ur: NEGATIVE mg/dL
SPECIFIC GRAVITY, URINE: 1.002 — AB (ref 1.005–1.030)

## 2016-11-20 LAB — COMPREHENSIVE METABOLIC PANEL
ALK PHOS: 69 U/L (ref 38–126)
ALT: 300 U/L — ABNORMAL HIGH (ref 17–63)
ANION GAP: 10 (ref 5–15)
AST: 211 U/L — ABNORMAL HIGH (ref 15–41)
Albumin: 4.5 g/dL (ref 3.5–5.0)
BILIRUBIN TOTAL: 1.3 mg/dL — AB (ref 0.3–1.2)
BUN: 12 mg/dL (ref 6–20)
CALCIUM: 9.7 mg/dL (ref 8.9–10.3)
CO2: 27 mmol/L (ref 22–32)
Chloride: 98 mmol/L — ABNORMAL LOW (ref 101–111)
Creatinine, Ser: 1.05 mg/dL (ref 0.61–1.24)
Glucose, Bld: 116 mg/dL — ABNORMAL HIGH (ref 65–99)
Potassium: 4.6 mmol/L (ref 3.5–5.1)
Sodium: 135 mmol/L (ref 135–145)
TOTAL PROTEIN: 8.3 g/dL — AB (ref 6.5–8.1)

## 2016-11-20 LAB — CBC WITH DIFFERENTIAL/PLATELET
BASOS PCT: 1 %
Basophils Absolute: 0 10*3/uL (ref 0.0–0.1)
EOS ABS: 0.5 10*3/uL (ref 0.0–0.7)
Eosinophils Relative: 6 %
HEMATOCRIT: 40.8 % (ref 39.0–52.0)
HEMOGLOBIN: 14.1 g/dL (ref 13.0–17.0)
Lymphocytes Relative: 39 %
Lymphs Abs: 3.4 10*3/uL (ref 0.7–4.0)
MCH: 30.5 pg (ref 26.0–34.0)
MCHC: 34.6 g/dL (ref 30.0–36.0)
MCV: 88.3 fL (ref 78.0–100.0)
MONO ABS: 0.6 10*3/uL (ref 0.1–1.0)
MONOS PCT: 6 %
NEUTROS ABS: 4.2 10*3/uL (ref 1.7–7.7)
NEUTROS PCT: 48 %
Platelets: 193 10*3/uL (ref 150–400)
RBC: 4.62 MIL/uL (ref 4.22–5.81)
RDW: 13 % (ref 11.5–15.5)
WBC: 8.6 10*3/uL (ref 4.0–10.5)

## 2016-11-20 LAB — CK: Total CK: 208 U/L (ref 49–397)

## 2016-11-20 MED ORDER — CYCLOBENZAPRINE HCL 10 MG PO TABS: 10.0000 mg | ORAL_TABLET | Freq: Three times a day (TID) | ORAL | 0 refills | Status: DC

## 2016-11-20 MED ORDER — ACETAMINOPHEN 500 MG PO TABS
1000.0000 mg | ORAL_TABLET | Freq: Once | ORAL | Status: AC
  Administered 2016-11-20: 1000 mg via ORAL
  Filled 2016-11-20: qty 2

## 2016-11-20 MED ORDER — ACETAMINOPHEN 500 MG PO TABS: 1000.0000 mg | ORAL_TABLET | Freq: Four times a day (QID) | ORAL | 0 refills | Status: DC | PRN

## 2016-11-20 MED ORDER — TRAMADOL HCL 50 MG PO TABS
100.0000 mg | ORAL_TABLET | Freq: Once | ORAL | Status: AC
  Administered 2016-11-20: 100 mg via ORAL
  Filled 2016-11-20: qty 2

## 2016-11-20 MED ORDER — TRAMADOL HCL 50 MG PO TABS: 100.0000 mg | ORAL_TABLET | Freq: Four times a day (QID) | ORAL | 0 refills | Status: DC | PRN

## 2016-11-20 NOTE — ED Triage Notes (Signed)
Pt with R lower back pain for about a week along with leg cramping and hand cramping. No meds PTA. Pain 8/10. No dysuria, but endorses lower ab pressure when urinating. Afebrile.

## 2016-11-20 NOTE — ED Provider Notes (Signed)
[facility]-EMERGENCY DEPT Provider Note   CSN: [id number] Arrival date & time: 11/20/16  1653     History   Chief Complaint Chief Complaint  Patient presents with  . Back Pain    HPI Jerry Mitchell is a 41 y.o. male.  HPI Patient works with roofing. He reports for about a week he's had pain in his right lower back. He indicates his SI region he denies significant radiation into his buttock or leg. He has had no weakness or numbness of the legs. No pain burning or urgency with urination. He does identify some lower abdominal pressure and sometimes a sensation of pressure with urination. He is not had fever or chills. Patient does have a prior history of renal insufficiency and mild rhabdomyolysis. This was the main concern bringing he and his companion to the emergency department today. Past Medical History:  Diagnosis Date  . Hepatitis C     Patient Active Problem List   Diagnosis Date Noted  . Thrombocytopenia (HCC) 11/19/2015  . Hepatitis C 11/17/2015  . Hyponatremia 11/17/2015  . AKI (acute kidney injury) (HCC) 12/22/2014  . Elevated liver enzymes 12/22/2014  . Elevated fasting glucose 12/22/2014  . Rhabdomyolysis 12/21/2014    History reviewed. No pertinent surgical history.     Home Medications    Prior to Admission medications   Medication Sig Start Date End Date Taking? Authorizing Provider  Liniments (SALONPAS EX) Apply 1 patch topically as needed (for pain).   Yes [provider]  acetaminophen (TYLENOL) 500 MG tablet Take 2 tablets (1,000 mg total) by mouth every 6 (six) hours as needed. 11/20/16   Arby BarrettePfeiffer, Blimie Vaness, MD  cyclobenzaprine (FLEXERIL) 10 MG tablet Take 1 tablet (10 mg total) by mouth 3 (three) times daily. 11/20/16   Arby BarrettePfeiffer, Laresa Oshiro, MD  traMADol (ULTRAM) 50 MG tablet Take 2 tablets (100 mg total) by mouth every 6 (six) hours as needed. 11/20/16   Arby BarrettePfeiffer, Marvon Shillingburg, MD    Family History No family history on file.  Social History Social History    Substance Use Topics  . Smoking status: Light Tobacco Smoker  . Smokeless tobacco: Current User  . Alcohol use Yes     Allergies   Patient has no known allergies.   Review of Systems Review of Systems 10 Systems reviewed and are negative for acute change except as noted in the HPI.   Physical Exam Updated Vital Signs BP 118/82   Pulse (!) 57   Temp 98.3 F (36.8 C) (Oral)   Resp 16   SpO2 99%   Physical Exam  Constitutional: He is oriented to person, place, and time. He appears well-developed and well-nourished. No distress.  HENT:  Head: Normocephalic and atraumatic.  Nose: Nose normal.  Mouth/Throat: Oropharynx is clear and moist.  Eyes: Pupils are equal, round, and reactive to light. Conjunctivae and EOM are normal.  Neck: Neck supple.  Cardiovascular: Normal rate, regular rhythm, normal heart sounds and intact distal pulses.   Pulmonary/Chest: Effort normal and breath sounds normal.  Abdominal: Soft. Bowel sounds are normal. He exhibits no distension and no mass. There is no tenderness. There is no guarding.  Musculoskeletal: Normal range of motion.  No CVA tenderness. Patient endorses some discomfort to palpation over the right SI joint and iliac wing. Normal range of motion bilateral lower extremities. No peripheral edema or calf tenderness.  Neurological: He is alert and oriented to person, place, and time. No cranial nerve deficit. He exhibits normal muscle tone. Coordination normal.  Skin: Skin is warm and dry.  Psychiatric: He has a normal mood and affect.     ED Treatments / Results  Labs (all labs ordered are listed, but only abnormal results are displayed) Labs Reviewed  URINALYSIS, ROUTINE W REFLEX MICROSCOPIC - Abnormal; Notable for the following:       Result Value   Color, Urine STRAW (*)    Specific Gravity, Urine 1.002 (*)    All other components within normal limits  COMPREHENSIVE METABOLIC PANEL - Abnormal; Notable for the following:     Chloride 98 (*)    Glucose, Bld 116 (*)    Total Protein 8.3 (*)    AST 211 (*)    ALT 300 (*)    Total Bilirubin 1.3 (*)    All other components within normal limits  CBC WITH DIFFERENTIAL/PLATELET  CK    EKG  EKG Interpretation None       Radiology No results found.  Procedures Procedures (including critical care time)  Medications Ordered in ED Medications  traMADol (ULTRAM) tablet 100 mg (100 mg Oral Given 11/20/16 2239)  acetaminophen (TYLENOL) tablet 1,000 mg (1,000 mg Oral Given 11/20/16 2239)     Initial Impression / Assessment and Plan / ED Course  I have reviewed the triage vital signs and the nursing notes.  Pertinent labs & imaging results that were available during my care of the patient were reviewed by me and considered in my medical decision making (see chart for details).     Final Clinical Impressions(s) / ED Diagnoses   Final diagnoses:  Acute right-sided low back pain without sciatica  Patient has back pain that is most consistent with a musculoskeletal pain in the sacroiliac region. No radiculopathy. Does work as a Designer, fashion/clothingroofer. Only because the patient did have a history of rhabdo myolysis and acute kidney injury, labs were obtained. The patient and his companion were pressed for time to make the last bus transportation home. I did feel that with stable vital signs and a history that was most consistent with muscle skeletal back pain, the patient was stable to return home and not await his diagnostic results. I reviewed lab results and there is no appearance of AK I. Patient has absolute minimal CK elevation in the 200s range. Prior to discharge patient was counseled on resting, heat avoidance and rehydration with a 4 day work excuse provided. Patient has mild elevation in LFTs, this appears chronic and he has history of hepatitis C. Patient is not having left upper quadrant pain or fever to suggest any acute exacerbation of hepatitis related problems. Prior to  discharge, patient was counseled on signs and symptoms first return.  New Prescriptions Discharge Medication List as of 11/20/2016 10:43 PM    START taking these medications   Details  acetaminophen (TYLENOL) 500 MG tablet Take 2 tablets (1,000 mg total) by mouth every 6 (six) hours as needed., Starting Wed 11/20/2016, Print    cyclobenzaprine (FLEXERIL) 10 MG tablet Take 1 tablet (10 mg total) by mouth 3 (three) times daily., Starting Wed 11/20/2016, Print    traMADol (ULTRAM) 50 MG tablet Take 2 tablets (100 mg total) by mouth every 6 (six) hours as needed., Starting Wed 11/20/2016, Print         Arby BarrettePfeiffer, Kissie Ziolkowski, MD 11/21/16 [phone]

## 2017-01-27 ENCOUNTER — Emergency Department (HOSPITAL_COMMUNITY)
Admission: EM | Admit: 2017-01-27 | Discharge: 2017-01-27 | Disposition: A | Discharge status: Home / Self Care | Payer: Self-pay | Attending: Emergency Medicine | Admitting: Emergency Medicine

## 2017-01-27 ENCOUNTER — Emergency Department (HOSPITAL_COMMUNITY): Payer: Self-pay

## 2017-01-27 ENCOUNTER — Encounter (HOSPITAL_COMMUNITY): Payer: Self-pay | Admitting: Emergency Medicine

## 2017-01-27 DIAGNOSIS — Y999 Unspecified external cause status: Secondary | ICD-10-CM | POA: Insufficient documentation

## 2017-01-27 DIAGNOSIS — S161XXA Strain of muscle, fascia and tendon at neck level, initial encounter: Secondary | ICD-10-CM | POA: Insufficient documentation

## 2017-01-27 DIAGNOSIS — F17290 Nicotine dependence, other tobacco product, uncomplicated: Secondary | ICD-10-CM | POA: Insufficient documentation

## 2017-01-27 DIAGNOSIS — W11XXXA Fall on and from ladder, initial encounter: Secondary | ICD-10-CM | POA: Insufficient documentation

## 2017-01-27 DIAGNOSIS — Y929 Unspecified place or not applicable: Secondary | ICD-10-CM | POA: Insufficient documentation

## 2017-01-27 DIAGNOSIS — Y939 Activity, unspecified: Secondary | ICD-10-CM | POA: Insufficient documentation

## 2017-01-27 MED ORDER — IBUPROFEN 800 MG PO TABS: 800.0000 mg | ORAL_TABLET | Freq: Three times a day (TID) | ORAL | 0 refills | Status: AC

## 2017-01-27 MED ORDER — CYCLOBENZAPRINE HCL 10 MG PO TABS: 10.0000 mg | ORAL_TABLET | Freq: Three times a day (TID) | ORAL | 0 refills | Status: AC | PRN

## 2017-01-27 MED ORDER — CYCLOBENZAPRINE HCL 10 MG PO TABS
5.0000 mg | ORAL_TABLET | Freq: Once | ORAL | Status: AC
  Administered 2017-01-27: 5 mg via ORAL
  Filled 2017-01-27: qty 1

## 2017-01-27 MED ORDER — HYDROCODONE-ACETAMINOPHEN 5-325 MG PO TABS
1.0000 | ORAL_TABLET | Freq: Once | ORAL | Status: AC
  Administered 2017-01-27: 1 via ORAL
  Filled 2017-01-27: qty 1

## 2017-01-27 NOTE — ED Triage Notes (Signed)
Pt c/o neck pain x 1 month after falling from a ladder. Ambulatory without difficulty. A&O x 4.

## 2017-01-27 NOTE — ED Provider Notes (Signed)
[facility]PT Provider Note   CSN: [id number] Arrival date & time: 01/27/17  0227     History   Chief Complaint Chief Complaint  Patient presents with  . Neck Pain    HPI Jerry Mitchell is a 41 y.o. male.  Patient presents to the ER for evaluation of neck pain. Patient reports that he fell off of a ladder approximately one month ago. He reports that he landed on the back of his neck on a 4 x 4. No loss of consciousness. Since then he has been experiencing pain on the right side of his neck from the right posterior part of the neck around to the anterior aspect. Pain worsens when he turns to the right side. No numbness, tingling or weakness of extremities.      Past Medical History:  Diagnosis Date  . Hepatitis C     Patient Active Problem List   Diagnosis Date Noted  . Thrombocytopenia (HCC) 11/19/2015  . Hepatitis C 11/17/2015  . Hyponatremia 11/17/2015  . AKI (acute kidney injury) (HCC) 12/22/2014  . Elevated liver enzymes 12/22/2014  . Elevated fasting glucose 12/22/2014  . Rhabdomyolysis 12/21/2014    History reviewed. No pertinent surgical history.     Home Medications    Prior to Admission medications   Medication Sig Start Date End Date Taking? Authorizing Provider  cyclobenzaprine (FLEXERIL) 10 MG tablet Take 1 tablet (10 mg total) by mouth 3 (three) times daily as needed for muscle spasms. 01/27/17   Gilda Crease, MD  ibuprofen (ADVIL,MOTRIN) 800 MG tablet Take 1 tablet (800 mg total) by mouth 3 (three) times daily. 01/27/17   Gilda Crease, MD    Family History No family history on file.  Social History Social History  Substance Use Topics  . Smoking status: Light Tobacco Smoker  . Smokeless tobacco: Current User  . Alcohol use Yes     Allergies   Patient has no known allergies.   Review of Systems Review of Systems  Musculoskeletal: Positive for neck pain.  All other systems reviewed and are  negative.    Physical Exam Updated Vital Signs BP 117/75   Pulse 61   Temp 98.2 F (36.8 C) (Oral)   Resp 18   Ht  (1.803 m)   Wt 95.3 kg (210 lb)   SpO2 97%   BMI 29.29 kg/m   Physical Exam  Constitutional: He is oriented to person, place, and time. He appears well-developed and well-nourished. No distress.  HENT:  Head: Normocephalic and atraumatic.  Right Ear: Hearing normal.  Left Ear: Hearing normal.  Nose: Nose normal.  Mouth/Throat: Oropharynx is clear and moist and mucous membranes are normal.  Eyes: Pupils are equal, round, and reactive to light. Conjunctivae and EOM are normal.  Neck: Muscular tenderness present. Decreased range of motion (Pain with turning to the right) present.    Cardiovascular: Regular rhythm, S1 normal and S2 normal.  Exam reveals no gallop and no friction rub.   No murmur heard. Pulmonary/Chest: Effort normal and breath sounds normal. No respiratory distress. He exhibits no tenderness.  Abdominal: Soft. Normal appearance and bowel sounds are normal. There is no hepatosplenomegaly. There is no tenderness. There is no rebound, no guarding, no tenderness at McBurney's point and negative Murphy's sign. No hernia.  Neurological: He is alert and oriented to person, place, and time. He has normal strength. No cranial nerve deficit or sensory deficit. Coordination normal. GCS eye subscore is 4. GCS verbal subscore is  5. GCS motor subscore is 6.  Skin: Skin is warm, dry and intact. No rash noted. No cyanosis.  Psychiatric: He has a normal mood and affect. His speech is normal and behavior is normal. Thought content normal.  Nursing note and vitals reviewed.    ED Treatments / Results  Labs (all labs ordered are listed, but only abnormal results are displayed) Labs Reviewed - No data to display  EKG  EKG Interpretation None       Radiology Ct Cervical Spine Wo Contrast  Result Date: 01/27/2017 CLINICAL DATA:  Status post fall off  ladder 1 month ago, with worsening right-sided neck pain. Initial encounter. EXAM: CT CERVICAL SPINE WITHOUT CONTRAST TECHNIQUE: Multidetector CT imaging of the cervical spine was performed without intravenous contrast. Multiplanar CT image reconstructions were also generated. COMPARISON:  CT of the cervical spine performed 09/27/2015 FINDINGS: Alignment: Normal. Skull base and vertebrae: No acute fracture. No primary bone lesion or focal pathologic process. Soft tissues and spinal canal: No prevertebral fluid or swelling. No visible canal hematoma. Disc levels: Intervertebral disc spaces are preserved. Scattered anterior osteophytes are noted along the lower cervical spine. Upper chest: The minimally visualized lung apices are clear. The thyroid gland is unremarkable. Mild calcification is noted at the left carotid bifurcation. Other: The visualized portions of the brain are unremarkable. IMPRESSION: No evidence of fracture or subluxation along the cervical spine. Electronically Signed   By: Roanna Raider M.D.   On: 01/27/2017 04:02    Procedures Procedures (including critical care time)  Medications Ordered in ED Medications  HYDROcodone-acetaminophen (NORCO/VICODIN) 5-325 MG per tablet 1 tablet (1 tablet Oral Given 01/27/17 0347)  cyclobenzaprine (FLEXERIL) tablet 5 mg (5 mg Oral Given 01/27/17 0348)     Initial Impression / Assessment and Plan / ED Course  I have reviewed the triage vital signs and the nursing notes.  Pertinent labs & imaging results that were available during my care of the patient were reviewed by me and considered in my medical decision making (see chart for details).     Patient presents with right-sided neck pain that started after a fall. Patient describes a direct blow to the back part of the neck when he fell. He has diffuse right-sided tenderness on exam. He has increased pain and decreased range of motion turning to the right. He has normal neurologic function of  upper and lower extremities. CT scan does not show any acute abnormality.  Final Clinical Impressions(s) / ED Diagnoses   Final diagnoses:  Acute strain of neck muscle, initial encounter    New Prescriptions New Prescriptions   CYCLOBENZAPRINE (FLEXERIL) 10 MG TABLET    Take 1 tablet (10 mg total) by mouth 3 (three) times daily as needed for muscle spasms.   IBUPROFEN (ADVIL,MOTRIN) 800 MG TABLET    Take 1 tablet (800 mg total) by mouth 3 (three) times daily.     Gilda Crease, MD 01/27/17 [phone]

## 2017-03-27 IMAGING — US US RENAL
1 series · 14 of 25 positions shown · non-contrast
Comparison: None.

CLINICAL DATA: Acute renal failure.

EXAM:
RENAL / URINARY TRACT ULTRASOUND COMPLETE

[Series 1: us renal · 0.28mm/px · 14 of 58 slices shown]
[im 1/58]
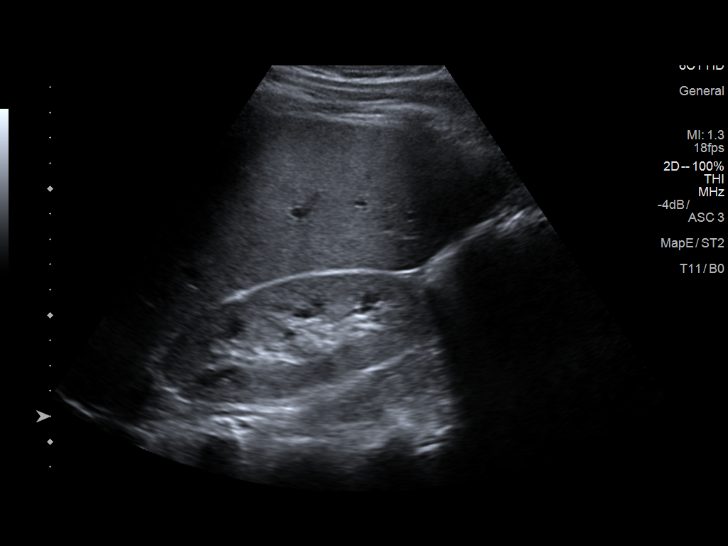
[im 5/58]
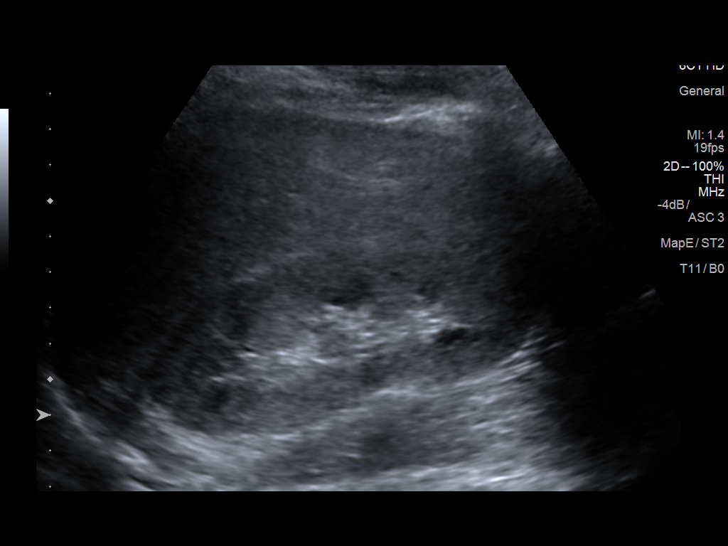
[im 10/58]
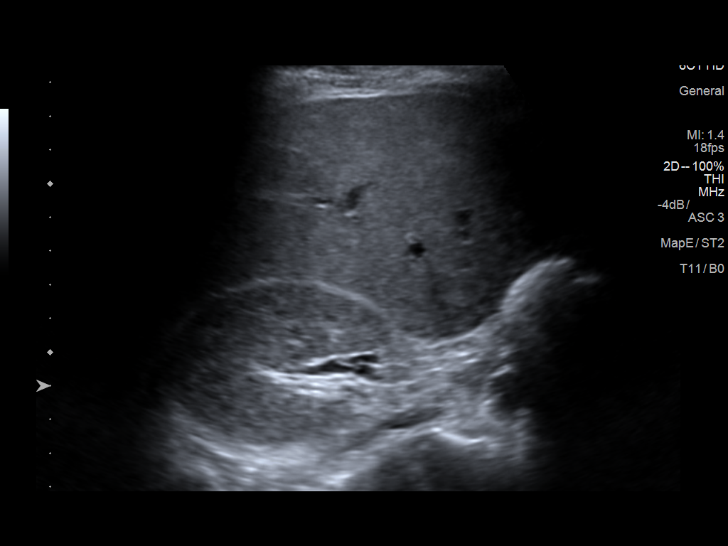
[im 15/58]
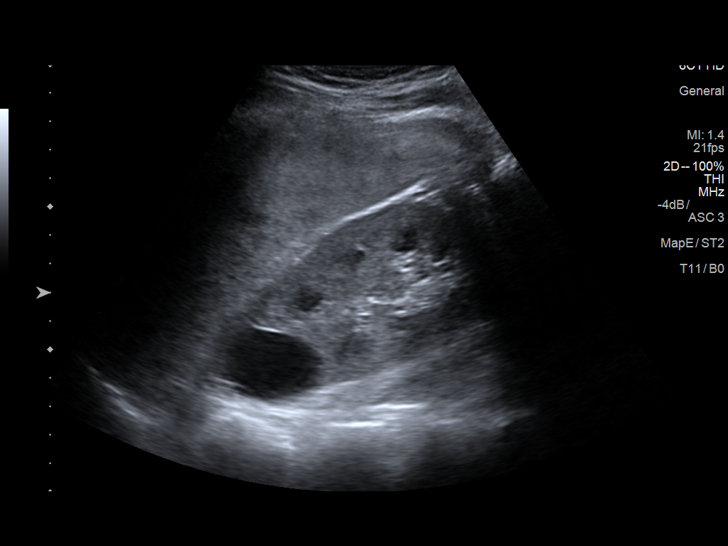
[im 20/58]
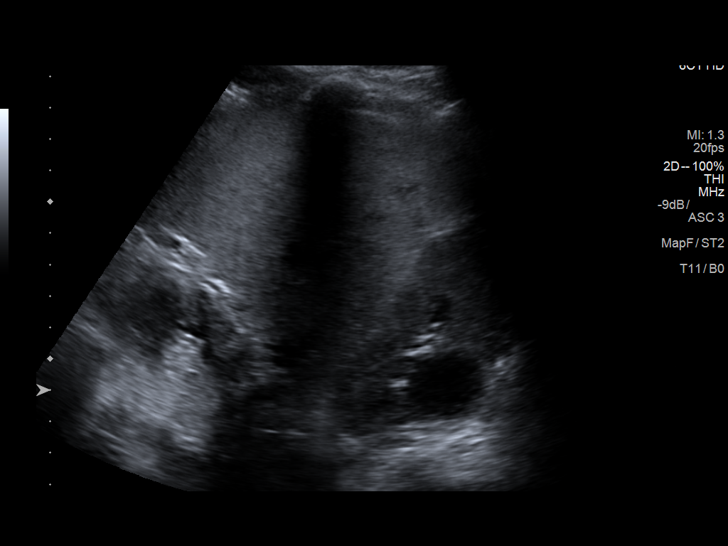
[im 22/58]
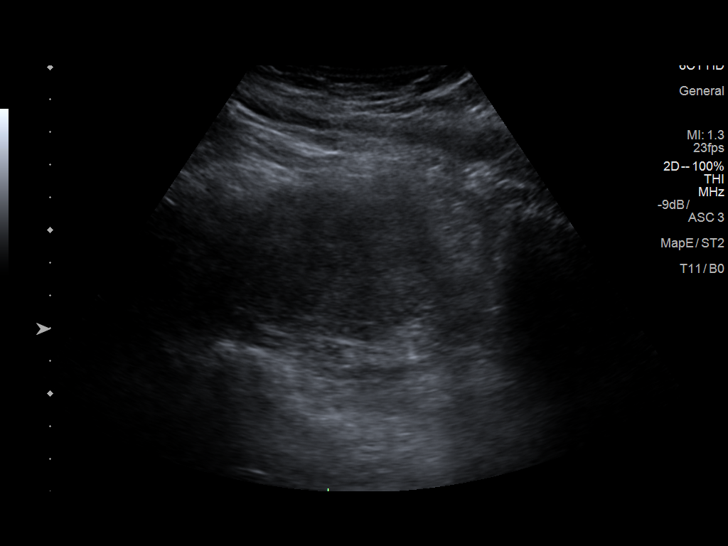
[im 27/58]
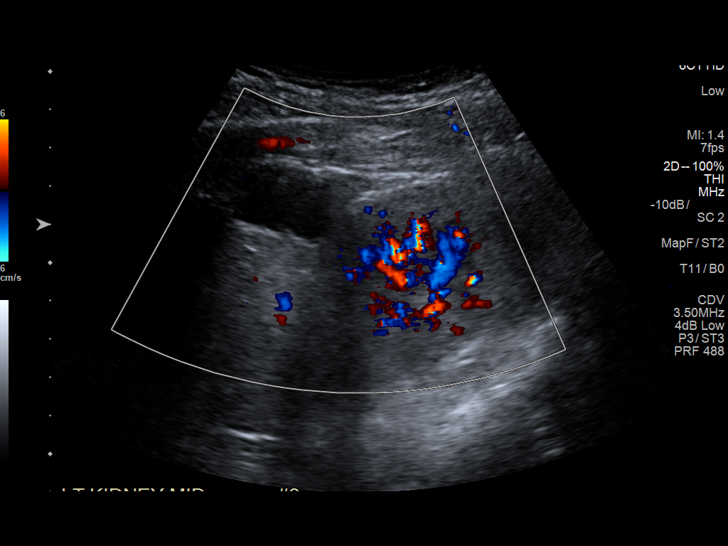
[im 31/58]
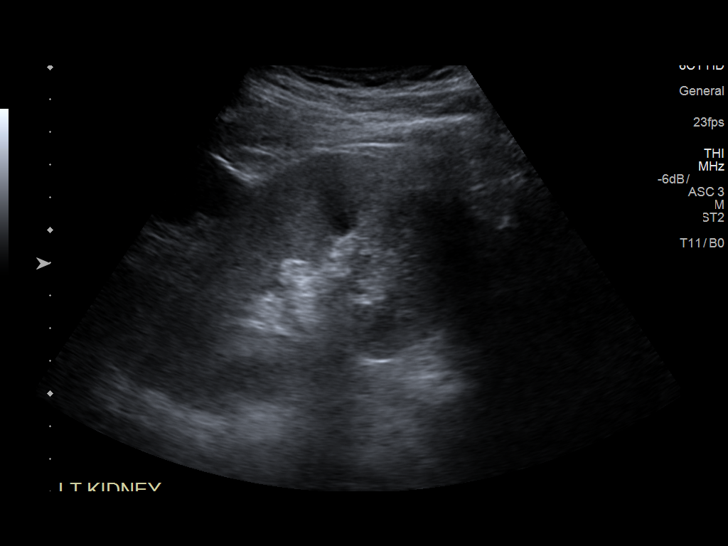
[im 36/58]
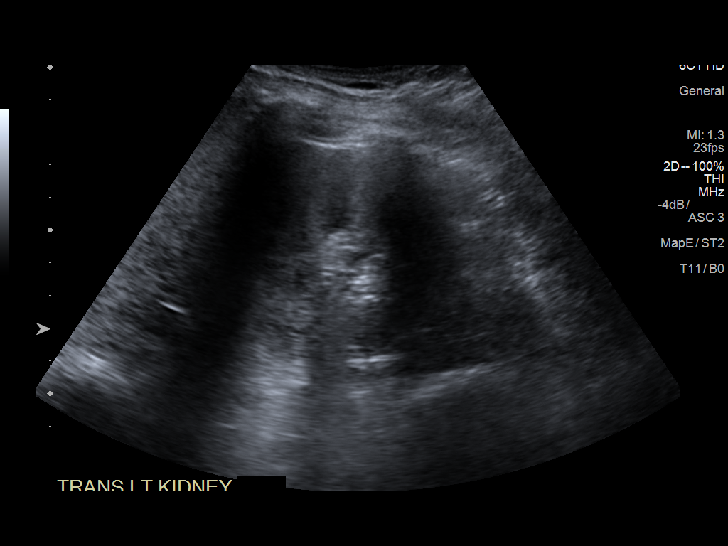
[im 39/58]
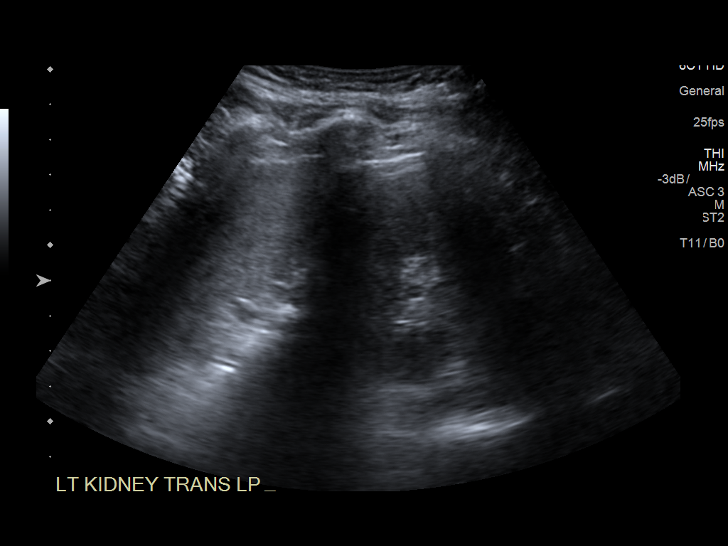
[im 43/58]
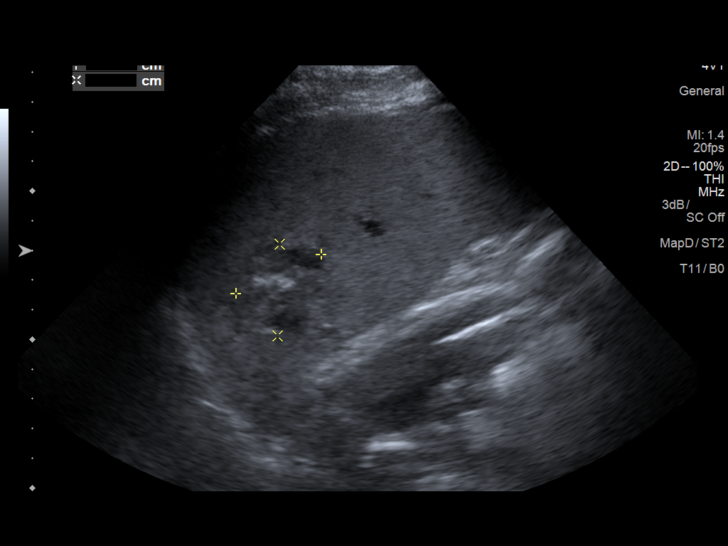
[im 48/58]
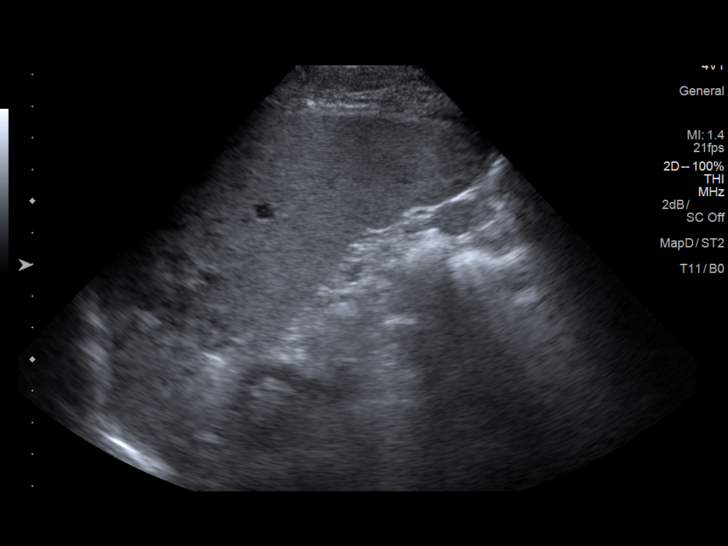
[im 53/58]
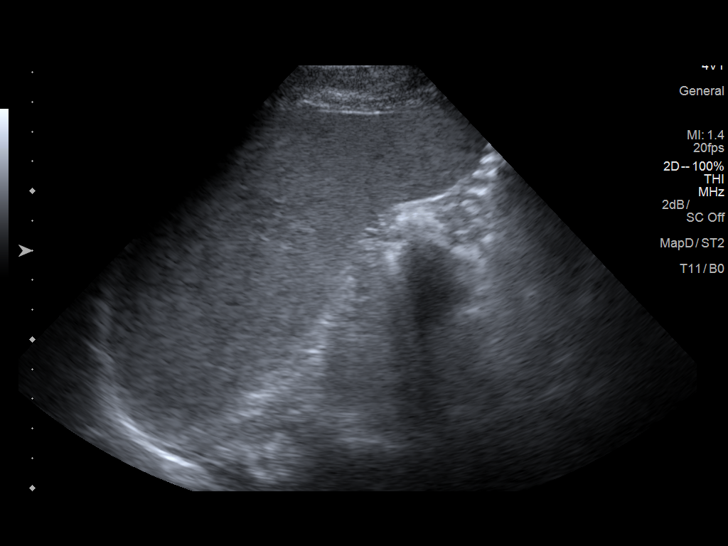
[im 58/58]
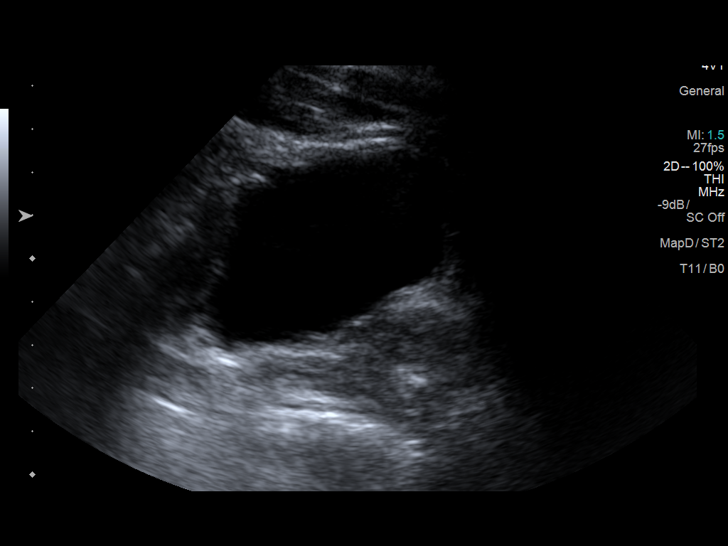

[14 of 25 positions shown; findings below may reference images not displayed]

FINDINGS: Right Kidney:

Length: 11.2 cm. Echogenicity within normal limits. No mass or
hydronephrosis visualized.

Left Kidney:

Length: 12.6 cm. Two 3 cm cysts are seen in the left kidney. No
hydronephrosis.

Bladder:

Appears normal for degree of bladder distention.

A mass is seen in the spleen measuring 3.2 x 3.1 x 2.7 cm,
heterogeneous in echogenic texture. A small accessory spleen is
identified. The spleen is enlarged measuring 13.1 cm in greatest
dimension with a volume of 777 cc.
IMPRESSION: 1. 3.2 cm mass in the spleen, nonspecific, but likely benign.
Recommend comparison to previous outside studies if available.
2. Splenomegaly.
3. Two left renal cysts.
4. No cause for acute renal failure/injury.

## 2019-07-05 IMAGING — CT CT CERVICAL SPINE W/O CM
3 of 4 series · 13 of 33 positions shown, 16 images · non-contrast
Comparison: CT of the cervical spine performed 09/27/2015

CLINICAL DATA: Status post fall off ladder 1 month ago, with
worsening right-sided neck pain. Initial encounter.

EXAM:
CT CERVICAL SPINE WITHOUT CONTRAST
TECHNIQUE: Multidetector CT imaging of the cervical spine was performed without
intravenous contrast. Multiplanar CT image reconstructions were also
generated.

[Series 7: sag bone · sagittal · 0.30mm/px · 5 of 61 slices shown, 6 images]
[im 21/61  bone]
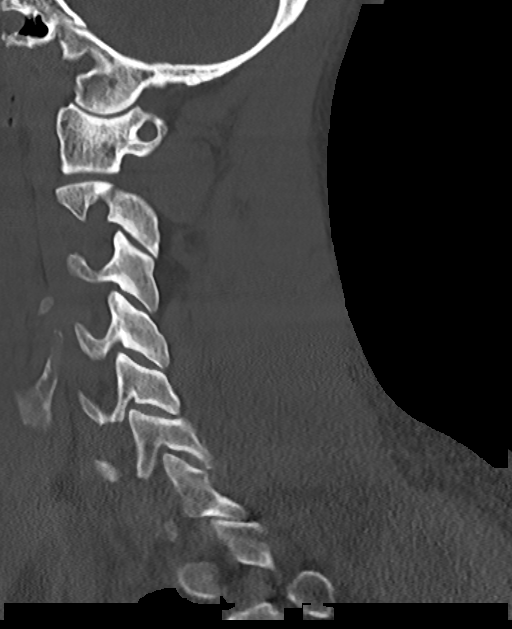
[im 26/61  bone]
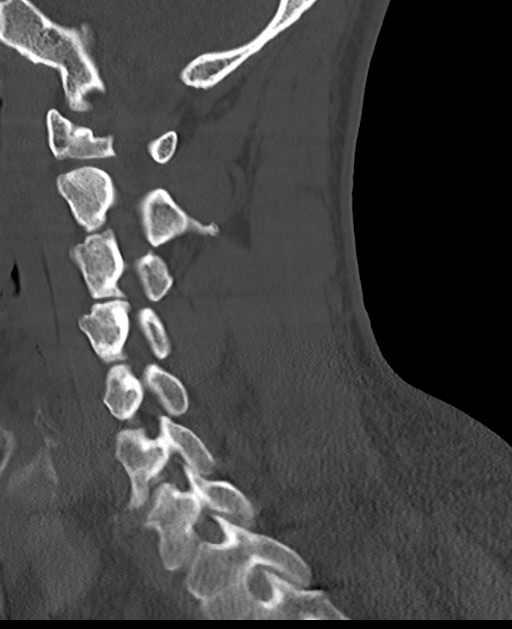
[im 31/61  soft-tissue]
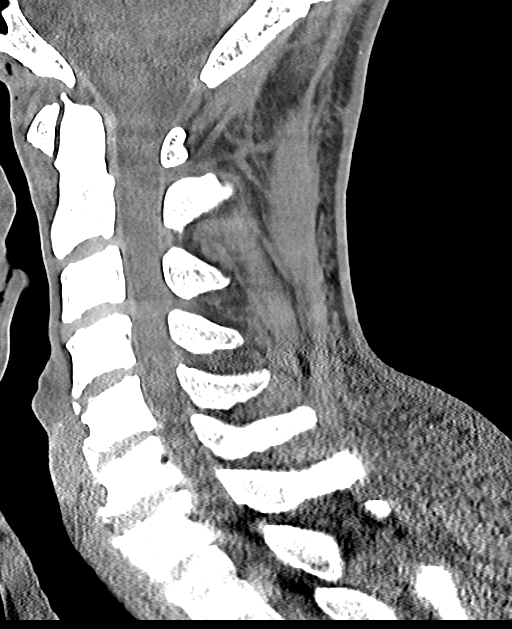
[im 31/61  bone]
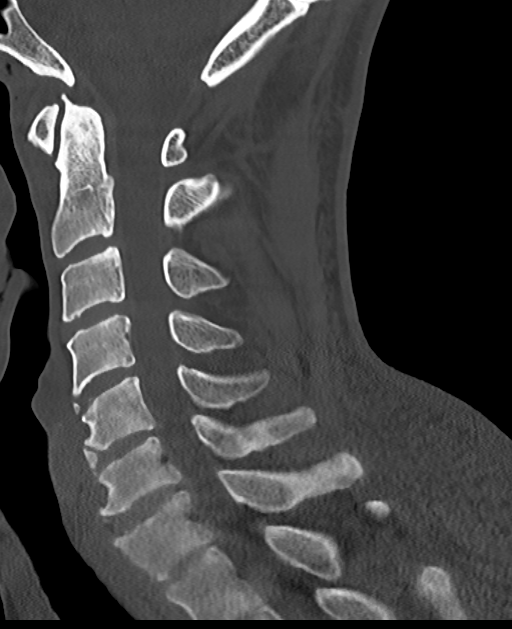
[im 36/61  bone]
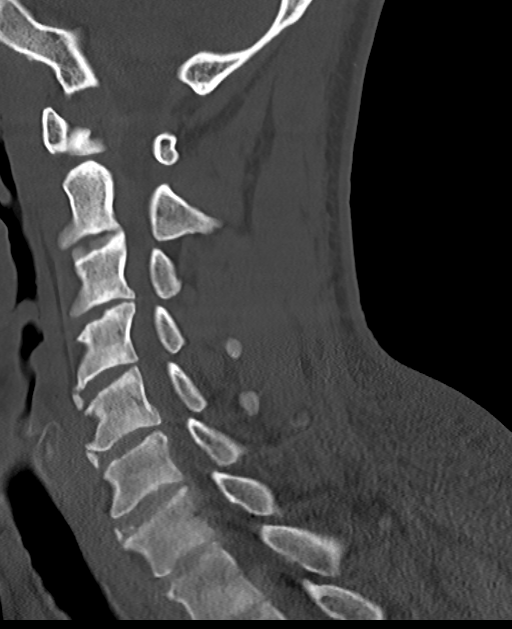
[im 41/61  bone]
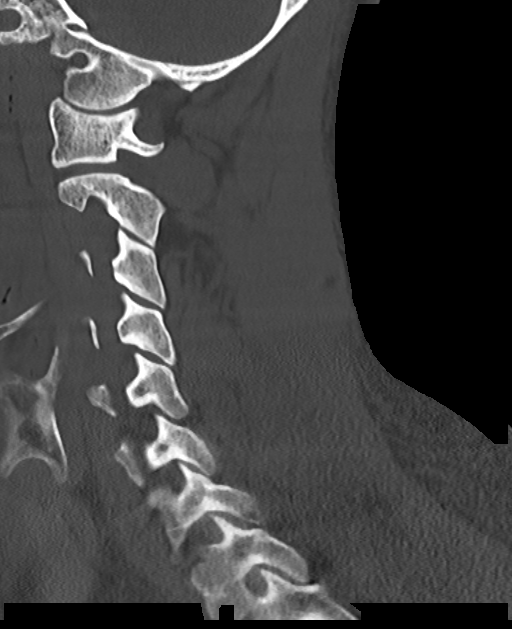

[Series 8: cor bone · coronal · 0.28mm/px · 3 of 61 slices shown]
[im 13/61  bone]
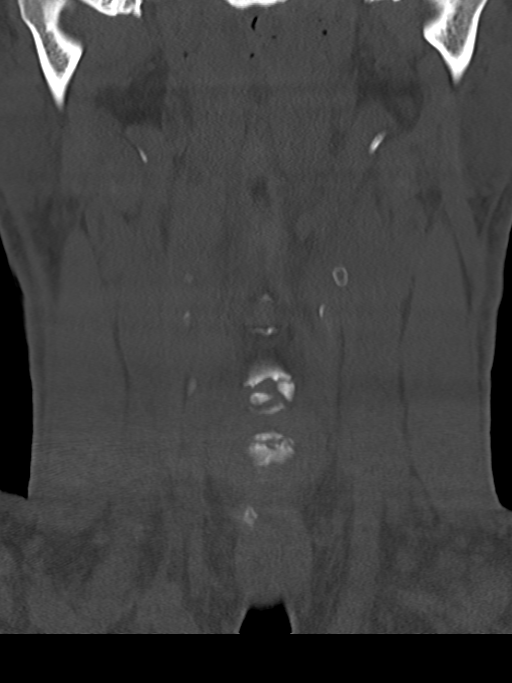
[im 25/61  bone]
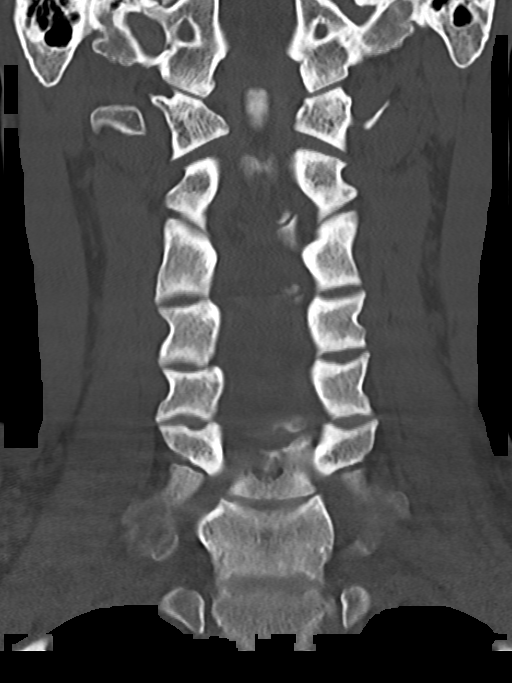
[im 37/61  bone]
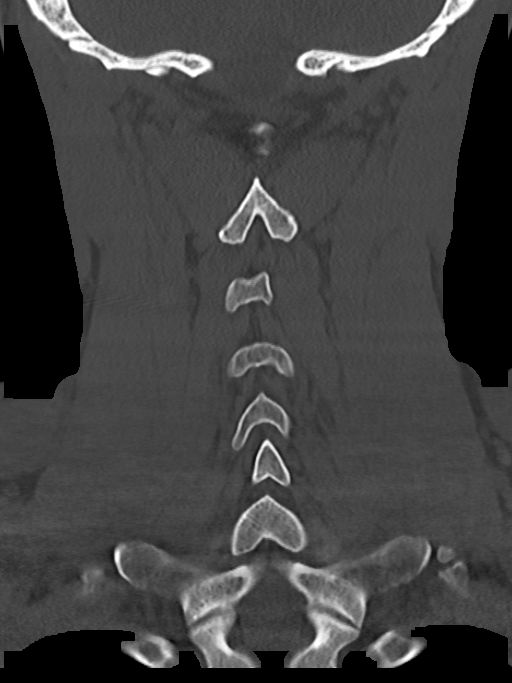

[Series 9: orthogonal axials · axial · 0.21mm/px · z∈[-281,-157]mm · 5 of 90 slices shown, 7 images]
[im 15/90  soft-tissue]
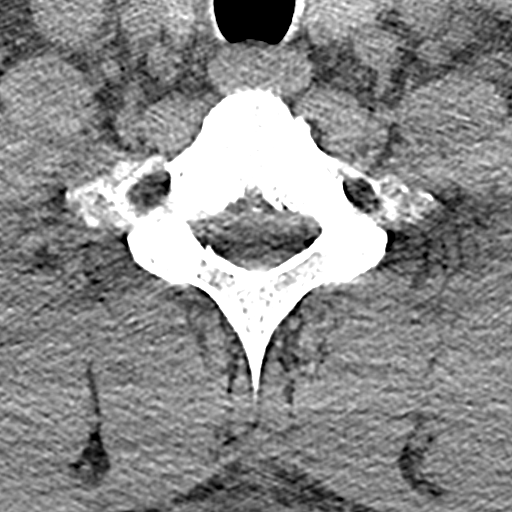
[im 15/90  bone]
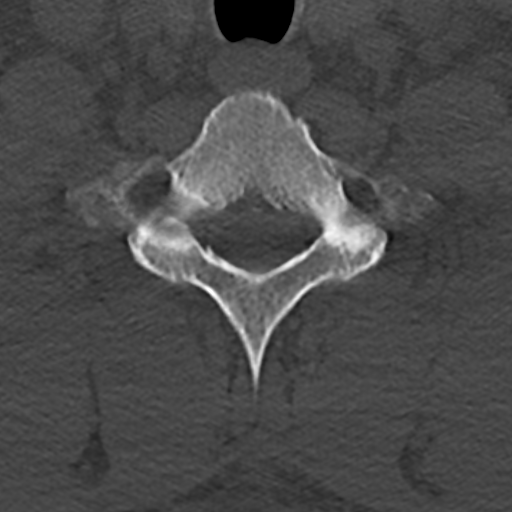
[im 30/90  bone]
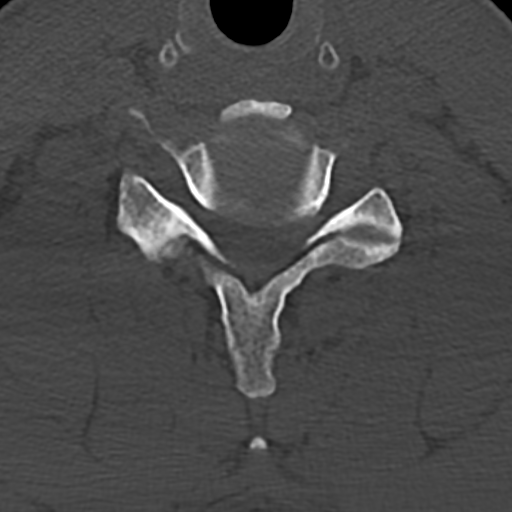
[im 45/90  bone]
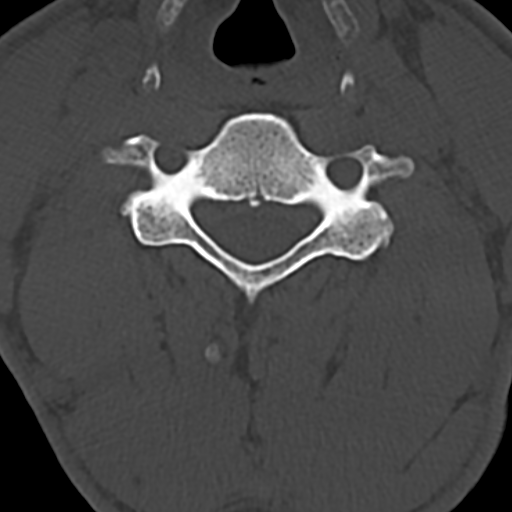
[im 60/90  bone]
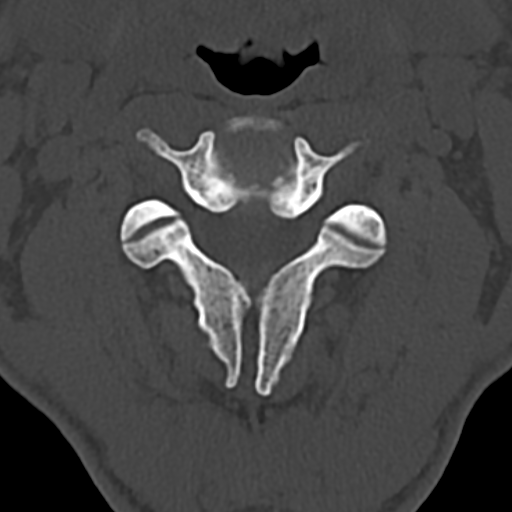
[im 75/90  soft-tissue]
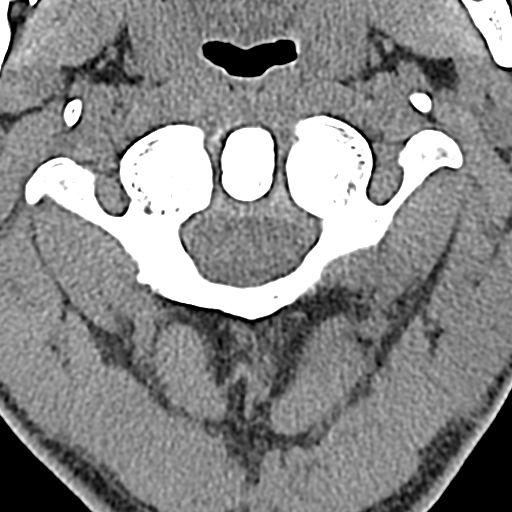
[im 75/90  bone]
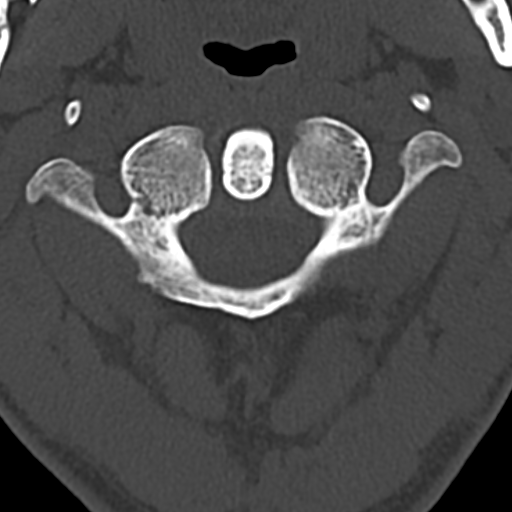

[13 of 33 positions shown; findings below may reference images not displayed]

FINDINGS: Alignment: Normal.

Skull base and vertebrae: No acute fracture. No primary bone lesion
or focal pathologic process.

Soft tissues and spinal canal: No prevertebral fluid or swelling. No
visible canal hematoma.

Disc levels: Intervertebral disc spaces are preserved. Scattered
anterior osteophytes are noted along the lower cervical spine.

Upper chest: The minimally visualized lung apices are clear. The
thyroid gland is unremarkable. Mild calcification is noted at the
left carotid bifurcation.

Other: The visualized portions of the brain are unremarkable.
IMPRESSION: No evidence of fracture or subluxation along the cervical spine.
